# Patient Record
Sex: Female | Born: 1960 | Race: White | Hispanic: No | Marital: Married | Smoking: Former smoker
Health system: Southern US, Community
[De-identification: ages and names within clinical notes are randomized; demographics above are authoritative.]

## PROBLEM LIST (undated history)

## (undated) DIAGNOSIS — I10 Essential (primary) hypertension: Secondary | ICD-10-CM

## (undated) DIAGNOSIS — E7800 Pure hypercholesterolemia, unspecified: Secondary | ICD-10-CM

## (undated) HISTORY — PX: SHOULDER SURGERY: SHX246

## (undated) HISTORY — PX: ABDOMINAL HYSTERECTOMY: SHX81

## (undated) HISTORY — PX: ELBOW SURGERY: SHX618

---

## 1998-06-24 ENCOUNTER — Other Ambulatory Visit: Admission: RE | Admit: 1998-06-24 | Discharge: 1998-06-24 | Payer: Self-pay | Admitting: *Deleted

## 1998-07-16 ENCOUNTER — Other Ambulatory Visit: Admission: RE | Admit: 1998-07-16 | Discharge: 1998-07-16 | Payer: Self-pay | Admitting: *Deleted

## 1998-12-14 ENCOUNTER — Encounter: Payer: Self-pay | Admitting: *Deleted

## 1998-12-16 ENCOUNTER — Encounter (INDEPENDENT_AMBULATORY_CARE_PROVIDER_SITE_OTHER): Payer: Self-pay | Admitting: Specialist

## 1998-12-16 ENCOUNTER — Observation Stay (HOSPITAL_COMMUNITY): Admission: RE | Admit: 1998-12-16 | Discharge: 1998-12-17 | Payer: Self-pay | Admitting: *Deleted

## 1999-07-25 ENCOUNTER — Other Ambulatory Visit: Admission: RE | Admit: 1999-07-25 | Discharge: 1999-07-25 | Payer: Self-pay | Admitting: *Deleted

## 2000-08-06 ENCOUNTER — Other Ambulatory Visit: Admission: RE | Admit: 2000-08-06 | Discharge: 2000-08-06 | Payer: Self-pay | Admitting: *Deleted

## 2001-08-12 ENCOUNTER — Other Ambulatory Visit: Admission: RE | Admit: 2001-08-12 | Discharge: 2001-08-12 | Payer: Self-pay | Admitting: *Deleted

## 2002-08-18 ENCOUNTER — Other Ambulatory Visit: Admission: RE | Admit: 2002-08-18 | Discharge: 2002-08-18 | Payer: Self-pay | Admitting: *Deleted

## 2003-08-31 ENCOUNTER — Other Ambulatory Visit: Admission: RE | Admit: 2003-08-31 | Discharge: 2003-08-31 | Payer: Self-pay | Admitting: *Deleted

## 2004-07-18 ENCOUNTER — Ambulatory Visit (HOSPITAL_COMMUNITY): Admission: RE | Admit: 2004-07-18 | Discharge: 2004-07-18 | Payer: Self-pay | Admitting: Otolaryngology

## 2004-07-18 ENCOUNTER — Ambulatory Visit (HOSPITAL_BASED_OUTPATIENT_CLINIC_OR_DEPARTMENT_OTHER): Admission: RE | Admit: 2004-07-18 | Discharge: 2004-07-18 | Payer: Self-pay | Admitting: Otolaryngology

## 2004-09-07 ENCOUNTER — Other Ambulatory Visit: Admission: RE | Admit: 2004-09-07 | Discharge: 2004-09-07 | Payer: Self-pay | Admitting: *Deleted

## 2005-10-04 ENCOUNTER — Other Ambulatory Visit: Admission: RE | Admit: 2005-10-04 | Discharge: 2005-10-04 | Payer: Self-pay | Admitting: *Deleted

## 2006-10-29 ENCOUNTER — Other Ambulatory Visit: Admission: RE | Admit: 2006-10-29 | Discharge: 2006-10-29 | Payer: Self-pay | Admitting: *Deleted

## 2010-09-09 NOTE — Op Note (Signed)
Shelly Tran, Shelly Tran                ACCOUNT NO.:  [id number]   MEDICAL RECORD NO.:  [id number]          PATIENT TYPE:  AMB   LOCATION:  DSC                          FACILITY:  [facility]   PHYSICIAN:  Kinnie Scales. Annalee Genta, M.D.DATE OF BIRTH:  1960/09/10   DATE OF PROCEDURE:  07/18/2004  DATE OF DISCHARGE:                                 OPERATIVE REPORT   PREOPERATIVE DIAGNOSES:  1.  Deviated nasal septum.  2.  Bilateral inferior turbinate hypertrophy.  3.  Nasal obstruction.   POSTOPERATIVE DIAGNOSES:  1.  Deviated nasal septum.  2.  Bilateral inferior turbinate hypertrophy.  3.  Nasal obstruction.   INDICATIONS FOR SURGERY:  1.  Deviated nasal septum.  2.  Bilateral inferior turbinate hypertrophy.  3.  Nasal obstruction.   SURGICAL PROCEDURES:  1.  Nasal septoplasty.  2.  Bilateral inferior turbinate reduction.   ANESTHESIA:  General endotracheal.   SURGEON:  Kinnie Scales. Annalee Genta, M.D.   COMPLICATIONS:  None.   ESTIMATED BLOOD LOSS:  Less than 50 mL.   Patient transferred from the operating room to the recovery room in stable  condition.   BRIEF HISTORY:  The patient is a 50 year old white female who is referred  for evaluation of chronic nasal airway obstruction.  Examination revealed a  deviated nasal septum with severe posterior right nasal septal spurring and  obstruction of the nasal cavity.  She had turbinate hypertrophy and symptoms  of chronic nasal airway obstruction.  Given her history, examination and  findings and failure to respond to topical steroid therapy and  antihistamines, I recommended that we consider her for nasal septoplasty and  turbinate reduction.  Risks, benefits, and possible complications of the  surgical procedure were discussed in detail with the patient and her  husband, and they understood and concurred with our plan for surgery, which  was scheduled as above.   PROCEDURE:  The patient was brought to the operating room on July 18, 2004,  and placed in the supine position on the operating table.  General  endotracheal anesthesia was established without difficulty, and the patient  was adequately anesthetized.  Her nose was injected with 7 mL of 1%  lidocaine and 1:100,000 solution of epinephrine injected in a submucosal  fashion along the nasal septum and inferior turbinates bilaterally.  The  patient's nose was then packed with Afrin-soaked cottonoid pledgets and left  place for approximately 10 minutes to allow for vasoconstriction and  hemostasis.  The surgical procedure was begun after the patient was prepped  and draped in a sterile fashion by creating a right anterior hemitransfixion  incision.  This was carried through the mucosa and underlying submucosa, and  a mucoperichondrial flap was elevated from anterior to posterior along the  patient's right-hand side.  The bony-cartilaginous junction was crossed and  a mucoperichondrial flap was elevated on the patient's left-hand side.  Deviated bone and cartilage were then resected.  The mid-aspect of the nasal  septal cartilage was removed, morcellized and returned to the  mucoperichondrial pocket at the conclusion of the surgical procedure.  The  patient had  a large bony deviation and septal spur, which was resected with  a 4 mm osteotome, preserving the overlying mucosa.  With the septum brought  to the middle, the removed cartilage was morcellized and returned and the  mucoperichondrial flaps were reapproximated with a 4-0 gut suture in a  horizontal mattressing fashion.  Bilateral nasal septal splints were then  placed after the application of Bactroban ointment and were sutured in  position with a 3-0 Ethilon suture.   Bilateral inferior turbinate reduction was then performed with the cautery  set at 12 watts.  Two submucosal passes were made at each inferior turbinate  using bipolar intramural cautery.  When the turbinates had been adequately  cauterized, the  anterior inferior aspect of the turbinate was resected using  through-cutting forceps, removing turbinate mucosa and bone.  The turbinates  were then outfractured to create a more patent nasal cavity.  The patient  was then awakened from the anesthetic.  She was extubated and was  transferred from the operating room to the recovery room in stable  condition.      DLS/MEDQ  D:  16/01/9603  T:  07/18/2004  Job:  [id number]

## 2012-10-09 ENCOUNTER — Emergency Department (HOSPITAL_COMMUNITY): Payer: BC Managed Care – PPO

## 2012-10-09 ENCOUNTER — Observation Stay (HOSPITAL_COMMUNITY)
Admission: EM | Admit: 2012-10-09 | Discharge: 2012-10-10 | Disposition: A | Discharge status: Home / Self Care | Payer: BC Managed Care – PPO | Attending: Internal Medicine | Admitting: Internal Medicine

## 2012-10-09 ENCOUNTER — Encounter (HOSPITAL_COMMUNITY): Payer: Self-pay | Admitting: *Deleted

## 2012-10-09 DIAGNOSIS — Z79899 Other long term (current) drug therapy: Secondary | ICD-10-CM | POA: Insufficient documentation

## 2012-10-09 DIAGNOSIS — E785 Hyperlipidemia, unspecified: Secondary | ICD-10-CM | POA: Diagnosis present

## 2012-10-09 DIAGNOSIS — I10 Essential (primary) hypertension: Secondary | ICD-10-CM | POA: Diagnosis present

## 2012-10-09 DIAGNOSIS — F17200 Nicotine dependence, unspecified, uncomplicated: Secondary | ICD-10-CM

## 2012-10-09 DIAGNOSIS — R079 Chest pain, unspecified: Principal | ICD-10-CM | POA: Diagnosis present

## 2012-10-09 DIAGNOSIS — E7800 Pure hypercholesterolemia, unspecified: Secondary | ICD-10-CM | POA: Insufficient documentation

## 2012-10-09 HISTORY — DX: Essential (primary) hypertension: I10

## 2012-10-09 HISTORY — DX: Pure hypercholesterolemia, unspecified: E78.00

## 2012-10-09 LAB — CBC WITH DIFFERENTIAL/PLATELET
Basophils Absolute: 0 10*3/uL (ref 0.0–0.1)
Basophils Relative: 0 % (ref 0–1)
Eosinophils Absolute: 0.1 10*3/uL (ref 0.0–0.7)
Eosinophils Relative: 2 % (ref 0–5)
HCT: 44.8 % (ref 36.0–46.0)
Hemoglobin: 14.8 g/dL (ref 12.0–15.0)
Lymphocytes Relative: 42 % (ref 12–46)
Lymphs Abs: 2.8 10*3/uL (ref 0.7–4.0)
MCH: 31.4 pg (ref 26.0–34.0)
MCHC: 33 g/dL (ref 30.0–36.0)
MCV: 95.1 fL (ref 78.0–100.0)
Monocytes Absolute: 0.5 10*3/uL (ref 0.1–1.0)
Monocytes Relative: 7 % (ref 3–12)
Neutro Abs: 3.2 10*3/uL (ref 1.7–7.7)
Neutrophils Relative %: 49 % (ref 43–77)
Platelets: 251 10*3/uL (ref 150–400)
RBC: 4.71 MIL/uL (ref 3.87–5.11)
RDW: 13.4 % (ref 11.5–15.5)
WBC: 6.6 10*3/uL (ref 4.0–10.5)

## 2012-10-09 LAB — BASIC METABOLIC PANEL
BUN: 11 mg/dL (ref 6–23)
CO2: 26 mEq/L (ref 19–32)
Calcium: 10 mg/dL (ref 8.4–10.5)
Chloride: 103 mEq/L (ref 96–112)
Creatinine, Ser: 0.64 mg/dL (ref 0.50–1.10)
GFR calc Af Amer: >90 mL/min (ref >90)
GFR calc non Af Amer: >90 mL/min (ref >90)
Glucose, Bld: 122 mg/dL — ABNORMAL HIGH (ref 70–99)
Potassium: 3.9 mEq/L (ref 3.5–5.1)
Sodium: 140 mEq/L (ref 135–145)

## 2012-10-09 LAB — TROPONIN I: Troponin I: <0.3 ng/mL (ref <0.30)

## 2012-10-09 MED ORDER — SODIUM CHLORIDE 0.9 % IV SOLN: 250.0000 mL | INTRAVENOUS | Status: DC | PRN

## 2012-10-09 MED ORDER — NITROGLYCERIN 0.4 MG SL SUBL: 0.4000 mg | SUBLINGUAL_TABLET | SUBLINGUAL | Status: DC | PRN

## 2012-10-09 MED ORDER — CAPTOPRIL 25 MG PO TABS
25.0000 mg | ORAL_TABLET | Freq: Every day | ORAL | Status: DC
  Administered 2012-10-09 – 2012-10-10 (×2): 25 mg via ORAL
  Filled 2012-10-09 (×2): qty 1

## 2012-10-09 MED ORDER — ENOXAPARIN SODIUM 40 MG/0.4ML ~~LOC~~ SOLN
40.0000 mg | SUBCUTANEOUS | Status: DC
  Administered 2012-10-09: 40 mg via SUBCUTANEOUS
  Filled 2012-10-09 (×2): qty 0.4

## 2012-10-09 MED ORDER — NITROGLYCERIN 0.4 MG SL SUBL
0.4000 mg | SUBLINGUAL_TABLET | SUBLINGUAL | Status: DC | PRN
  Administered 2012-10-09: 0.4 mg via SUBLINGUAL
  Filled 2012-10-09: qty 25

## 2012-10-09 MED ORDER — SODIUM CHLORIDE 0.9 % IJ SOLN
3.0000 mL | Freq: Two times a day (BID) | INTRAMUSCULAR | Status: DC
  Administered 2012-10-10: 3 mL via INTRAVENOUS

## 2012-10-09 MED ORDER — ASPIRIN EC 81 MG PO TBEC
81.0000 mg | DELAYED_RELEASE_TABLET | Freq: Every day | ORAL | Status: DC
  Administered 2012-10-10: 81 mg via ORAL
  Filled 2012-10-09: qty 1

## 2012-10-09 MED ORDER — ACETAMINOPHEN 500 MG PO TABS: 500.0000 mg | ORAL_TABLET | Freq: Four times a day (QID) | ORAL | Status: DC | PRN

## 2012-10-09 MED ORDER — SODIUM CHLORIDE 0.9 % IJ SOLN: 3.0000 mL | INTRAMUSCULAR | Status: DC | PRN

## 2012-10-09 MED ORDER — SODIUM CHLORIDE 0.9 % IJ SOLN
3.0000 mL | Freq: Two times a day (BID) | INTRAMUSCULAR | Status: DC
  Administered 2012-10-09: 3 mL via INTRAVENOUS

## 2012-10-09 MED ORDER — ASPIRIN 81 MG PO CHEW
324.0000 mg | CHEWABLE_TABLET | Freq: Once | ORAL | Status: AC
  Administered 2012-10-09: 324 mg via ORAL
  Filled 2012-10-09: qty 4

## 2012-10-09 MED ORDER — ATORVASTATIN CALCIUM 20 MG PO TABS
20.0000 mg | ORAL_TABLET | Freq: Every day | ORAL | Status: DC
  Administered 2012-10-09 – 2012-10-10 (×2): 20 mg via ORAL
  Filled 2012-10-09 (×2): qty 1

## 2012-10-09 NOTE — ED Notes (Signed)
Pt states at 7:15 this morning started having constant, pressure, crushing mid/L sided chest pain, states also had shortness of breath, denies dizziness/lightheadedness/n/v, pt states pain when it started was 8/10, chest pain now is 3/10, pt states she still feels like she can't take a deep breath.

## 2012-10-09 NOTE — Progress Notes (Signed)
Pt states pcp is Maurice Small EPIC updated

## 2012-10-09 NOTE — H&P (Signed)
[facility] History and Physical  Chandler Stofer WUJ:[id number] DOB: 1960-06-28 DOA: 10/09/2012  Referring physician: Dr Juleen China PCP: Astrid Divine, MD   Chief Complaint: Chest pain since one day  HPI:  52 year old female with history of hypertension, hyperlipidemia, history of smoking for possibly 5 years presented to the ED with severe left-sided chest pain 7-8/10 in intensity, nonradiating lasting for almost 45 minutes and without any aggravating or relieving factors. He shouldn't describes the pain to be of severe tightening as if somebody was sitting on her chest. She denies any shortness of breath, orthopnea or PND. She did feel sweaty during this episode. She also informs of having some palpitations last night. Patient denies similar symptoms in the past. She denies any trauma to the chest or lifting heavy weight or heavy exercising recently. She denies any fever, chills, headache, blurred vision, abdominal pain, nausea, vomiting, bowel or urinary symptoms. Denies any sick contacts or recent URI symptoms. Denies having any stress test in the past. At baseline patient is quite active and works out almost regularly which includes biking and swimming.  In the ED patient's vitals were stable. EKG done was unremarkable and initial troponin was negative. Labs and chest x-ray were within normal limits. Patient given overgrows of aspirin and sublingual nitroglycerin following which her chest pain subsided within the next hour. Prior hospitalists called for admission and observation to rule out for ACS.  Review of Systems:  Constitutional: diaphoresis, Denies fever, chills,  appetite change and fatigue.  HEENT: Denies photophobia, eye pain, redness, hearing loss, ear pain, congestion, sore throat, rhinorrhea, sneezing, mouth sores, trouble swallowing, neck pain, neck stiffness and tinnitus.   Respiratory: chest tightness,Denies SOB, DOE, cough,   and wheezing.   Cardiovascular:  chest  pain present, denies palpitations and leg swelling.  Gastrointestinal: Denies nausea, vomiting, abdominal pain, diarrhea, constipation, blood in stool and abdominal distention.  Genitourinary: Denies dysuria, urgency, frequency, hematuria, flank pain and difficulty urinating.  Endocrine: Denies: hot or cold intolerance, sweats, polyuria, polydipsia. Musculoskeletal: Denies myalgias, back pain, joint swelling, arthralgias and gait problem.  Neurological: Denies dizziness, seizures, syncope, weakness, light-headedness, numbness and headaches.  Hematological: Denies adenopathy. Easy bruising, personal or family bleeding history     Past Medical History  Diagnosis Date  . Hypertension   . High cholesterol    Past Surgical History  Procedure Laterality Date  . Shoulder surgery    . Elbow surgery    . Abdominal hysterectomy     Social History:  reports that she has been smoking Cigarettes.  She has been smoking about 0.00 packs per day. She has never used smokeless tobacco. She reports that she does not drink alcohol or use illicit drugs.  No Known Allergies  Family History  Problem Relation Age of Onset  . High blood pressure Mother   . Hyperlipidemia Mother   . High blood pressure Father   . Leukemia Father     Prior to Admission medications   Medication Sig Start Date End Date Taking? Authorizing Provider  acetaminophen (TYLENOL) 500 MG tablet Take 500 mg by mouth every 6 (six) hours as needed for pain.   Yes Historical Provider, MD  atorvastatin (LIPITOR) 20 MG tablet Take 20 mg by mouth daily.   Yes Historical Provider, MD  captopril (CAPOTEN) 25 MG tablet Take 25 mg by mouth daily.   Yes Historical Provider, MD    Physical Exam:  Filed Vitals:   10/09/12 1100 10/09/12 1130 10/09/12 1204 10/09/12 1357  BP: 132/75  128/76 160/85 148/88  Pulse: 60 59  51  Temp:   98.2 F (36.8 C)   TempSrc:   Oral   Resp: 19 18 14 16   SpO2: 99% 99% 98% 100%    Constitutional: Vital  signs reviewed.  Patient is a well-developed and well-nourished in no acute distress and cooperative with exam.  HEENT: No pallor, moist oral mucosa, no JVD Cardiovascular: RRR, S1 normal, S2 normal, no MRG, pulses symmetric and intact bilaterally Pulmonary/Chest: CTAB, no wheezes, rales, or rhonchi Abdominal: Soft. Non-tender, non-distended, bowel sounds are normal Ext: no edema and no cyanosis, pulses palpable b/l   Neurological: A&O x3, nonfocal   Labs on Admission:  Basic Metabolic Panel:  Recent Labs Lab 10/09/12 0841  NA 140  K 3.9  CL 103  CO2 26  GLUCOSE 122*  BUN 11  CREATININE 0.64  CALCIUM 10.0   Liver Function Tests: No results found for this basename: AST, ALT, ALKPHOS, BILITOT, PROT, ALBUMIN,  in the last 168 hours No results found for this basename: LIPASE, AMYLASE,  in the last 168 hours No results found for this basename: AMMONIA,  in the last 168 hours CBC:  Recent Labs Lab 10/09/12 0841  WBC 6.6  NEUTROABS 3.2  HGB 14.8  HCT 44.8  MCV 95.1  PLT 251   Cardiac Enzymes:  Recent Labs Lab 10/09/12 0841  TROPONINI <0.30   BNP: No components found with this basename: POCBNP,  CBG: No results found for this basename: GLUCAP,  in the last 168 hours  Radiological Exams on Admission: Dg Chest 2 View  10/09/2012   *RADIOLOGY REPORT*  Clinical Data: Chest pain.  Hypertension.  CHEST - 2 VIEW  Comparison: None.  Findings: The lungs are clear.  Heart size is normal.  No pneumothorax or pleural fluid.  No focal bony abnormality.  IMPRESSION: No acute disease.   Original Report Authenticated By: Holley Dexter, M.D.    EKG: Normal sinus rhythm at 84, no ST-T changes  Assessment/Plan  Chest pain Admit to telemetry under observation to rule out for ACS Initial EKG and troponin negative. Symptoms resolved after getting aspirin and sublingual nitroglycerin in the ED. -Continue on aspirin and sublingual nitroglycerin prn for chest pain. Risk factors  include hypertension, hyperlipidemia and active smoking. Check lipid panel and hemoglobin A1c Given underlying cardiac risk factors and chest pain symptoms which appears typical will rule out for ACS and if serial cardiac enzymes negative and symptoms resolved I will obtain a nuclear stress test for tomorrow morning.    Active Problems:   Hypertension Blood pressure stable. The humeral medications     Hyperlipidemia Check lipid panel. Resume home medications     Active smoker Counseled strongly on smoking cessation  Diet: Cardiac. N.p.o. after midnight if planned for stress tomorrow morning  DVT prophylaxis: Subcutaneous Lovenox  Code Status: Full code Family Communication: Sister at bedside Disposition Plan: Home tomorrow if ruled out for ACS  Eddie North [facility] Pager [phone]  If 7PM-7AM, please contact night-coverage [email or web address] Password [facility] 10/09/2012, 2:44 PM    Total time spent: 50 minutes

## 2012-10-09 NOTE — Care Management Note (Signed)
    Page 1 of 1   10/09/2012     4:01:58 PM   CARE MANAGEMENT NOTE 10/09/2012  Patient:  Shelly Tran, Shelly Tran   Account Number:  [id number]  Date Initiated:  10/09/2012  Documentation initiated by:  Lanier Clam  Subjective/Objective Assessment:   ADMITTED W/CHEST PAIN,HTN.HX:HTN     Action/Plan:   FROM HOME.HAS PCP,PHARMACY.   Anticipated DC Date:  10/10/2012   Anticipated DC Plan:  HOME/SELF CARE      DC Planning Services  CM consult      Choice offered to / List presented to:             Status of service:  In process, will continue to follow Medicare Important Message given?   (If response is "NO", the following Medicare IM given date fields will be blank) Date Medicare IM given:   Date Additional Medicare IM given:    Discharge Disposition:    Per UR Regulation:  Reviewed for med. necessity/level of care/duration of stay  If discussed at Long Length of Stay Meetings, dates discussed:    Comments:  10/09/12 [facility] RN,BSN NCM [phone]

## 2012-10-09 NOTE — ED Provider Notes (Signed)
History    52 year old female with chest pain. Onset around 7:15 this morning while sitting at her desk. Patient describes the pain as "crushing." Substernal to left anterior chest. No radiation. Pain has been constant since onset but has been slowly improving. It is not completely resolved. Pain worsened by deep inspiration. Patient has a sensation that she cannot take a deep breath. No fevers or chills. No coughing. No unusual leg pain or swelling. No nausea or diaphoresis. Patient states that she has had chest pain before but that prior episodes were very fleeting, lasting only seconds. Patient with no known history of coronary disease but she does have a history of hypertension, high cholesterol and she is a smoker. She's never had a stress test or catheterization. No interventions prior to arrival.   CSN: [id number]  Arrival date & time 10/09/12  1478   First MD Initiated Contact with Patient 10/09/12 0825      Chief Complaint  Patient presents with  . Chest Pain  . Shortness of Breath    (Consider location/radiation/quality/duration/timing/severity/associated sxs/prior treatment) HPI  Past Medical History  Diagnosis Date  . Hypertension   . High cholesterol     Past Surgical History  Procedure Laterality Date  . Shoulder surgery    . Elbow surgery    . Abdominal hysterectomy      No family history on file.  History  Substance Use Topics  . Smoking status: Current Some Day Smoker  . Smokeless tobacco: Never Used  . Alcohol Use: No    OB History   Grav Para Term Preterm Abortions TAB SAB Ect Mult Living                  Review of Systems  All systems reviewed and negative, other than as noted in HPI.  Allergies  Review of patient's allergies indicates not on file.  Home Medications  No current outpatient prescriptions on file.  BP 151/84  Pulse 81  Temp(Src) 98.2 F (36.8 C) (Oral)  Resp 13  SpO2 99%  Physical Exam  Nursing note and vitals  reviewed. Constitutional: She appears well-developed and well-nourished. No distress.  Laying in bed. NAD.   HENT:  Head: Normocephalic and atraumatic.  Eyes: Conjunctivae are normal. Right eye exhibits no discharge. Left eye exhibits no discharge.  Neck: Neck supple.  Cardiovascular: Normal rate, regular rhythm and normal heart sounds.  Exam reveals no gallop and no friction rub.   No murmur heard. Pulmonary/Chest: Effort normal and breath sounds normal. No respiratory distress. She exhibits no tenderness.  CP not reproducible with palpation.   Abdominal: Soft. She exhibits no distension. There is no tenderness.  Musculoskeletal: She exhibits no edema and no tenderness.  Lower extremities symmetric as compared to each other. No calf tenderness. Negative Homan's. No palpable cords.   Neurological: She is alert.  Skin: Skin is warm and dry. She is not diaphoretic.  Psychiatric: She has a normal mood and affect. Her behavior is normal. Thought content normal.    ED Course  Procedures (including critical care time)  Labs Reviewed  BASIC METABOLIC PANEL - Abnormal; Notable for the following:    Glucose, Bld 122 (*)    All other components within normal limits  CBC WITH DIFFERENTIAL  TROPONIN I   Dg Chest 2 View  10/09/2012   *RADIOLOGY REPORT*  Clinical Data: Chest pain.  Hypertension.  CHEST - 2 VIEW  Comparison: None.  Findings: The lungs are clear.  Heart size is  normal.  No pneumothorax or pleural fluid.  No focal bony abnormality.  IMPRESSION: No acute disease.   Original Report Authenticated By: Holley Dexter, M.D.    EKG:  Rhythm: normal sinus Vent. rate 84 BPM PR interval 136 ms QRS duration 90 ms QT/QTc 372/440 ms ST segments: NS ST changes. Some t wave flattening in v3 and aVL. Comparison: little interval change from 06/2004   1. Chest pain       MDM  52yF with CP. HPI concerning for cardiac etiology. Doubt infectious, PE, dissection. Possible spontaneous  pneumothorax, particularly with smoking hx and somewhat pleuritic nature. "Crushing" sensation atypical for this though.  Pt with no known CAD, but multiple risk factors. EKG fairly unremarkable. Plan basic labs, troponin and CXR. ASA and nitro given continued mild discomfort and HTN. I do not feel pt is low enough risk for DC particularly with symptoms starting shortly before arrival making enzymatic r/o in ED prohibitive.    W/u unremarkable. Pain resolved after nitro. Will discuss with medicine.      Raeford Razor, MD 10/09/12 [id number]

## 2012-10-10 ENCOUNTER — Encounter (HOSPITAL_COMMUNITY): Payer: Self-pay | Admitting: Cardiology

## 2012-10-10 ENCOUNTER — Other Ambulatory Visit: Payer: Self-pay | Admitting: *Deleted

## 2012-10-10 DIAGNOSIS — F17200 Nicotine dependence, unspecified, uncomplicated: Secondary | ICD-10-CM

## 2012-10-10 DIAGNOSIS — R079 Chest pain, unspecified: Secondary | ICD-10-CM

## 2012-10-10 DIAGNOSIS — I10 Essential (primary) hypertension: Secondary | ICD-10-CM

## 2012-10-10 DIAGNOSIS — E785 Hyperlipidemia, unspecified: Secondary | ICD-10-CM

## 2012-10-10 LAB — HEMOGLOBIN A1C: Hgb A1c MFr Bld: 5.4 % (ref <5.7)

## 2012-10-10 LAB — LIPID PANEL: LDL Cholesterol: 54 mg/dL (ref 0–99)

## 2012-10-10 NOTE — Discharge Summary (Addendum)
Physician Discharge Summary  Amyla Heffner RUE:[id number] DOB: 30-Oct-1960 DOA: 10/09/2012  PCP: Astrid Divine, MD  Admit date: 10/09/2012 Discharge date: 10/10/2012  Time spent: 35 minutes  Recommendations for Outpatient Follow-up:  1. [facility] 7/10 for Execise stress test on 7/10  Discharge Diagnoses:  Active Problems:   Chest pain, unspecified   Hypertension   Hyperlipidemia   Active smoker   Discharge Condition: stable  Diet recommendation: low sodium, heart healthy  Filed Weights   10/09/12 1535  Weight: 52.844 kg (116 lb 8 oz)    History of present illness:  52 year old female with history of hypertension, hyperlipidemia, history of smoking for possibly 5 years presented to the ED with severe left-sided chest pain 7-8/10 in intensity, nonradiating lasting for almost 45 minutes and without any aggravating or relieving factors. He shouldn't describes the pain to be of severe tightening as if somebody was sitting on her chest. She denies any shortness of breath, orthopnea or PND. She did feel sweaty during this episode. She also informs of having some palpitations last night. Patient denies similar symptoms in the past   Hospital Course:  Atypical chest pain with some typical features, this was resolved by admission with SL nitro and ASA,  EKG done was normal and 3 sets of cardiac enzymes were normal, no events on Telemetry, due to multiple risk factors was seen by Dr.Hochrein from [facility] cardiology in consultation and is being set up for an exercise stress test as outpatient on 7/10 at [facility].   Consultations:  [place] cardiology Dr.Hochrein  Discharge Exam: Filed Vitals:   10/09/12 1851 10/09/12 2042 10/10/12 0618 10/10/12 1001  BP: 123/72 110/67 119/72 130/75  Pulse: 57 56 62   Temp:  98.1 F (36.7 C) 97.8 F (36.6 C)   TempSrc:  Oral Oral   Resp:  18 20   Height:      Weight:      SpO2:  98% 100%     General:  AAOx3 Cardiovascular: S1S2/RRR Respiratory: CTAB  Discharge Instructions  Discharge Orders   Future Appointments Provider Department Dept Phone   10/31/2012 9:30 AM Dyann Kief, PA-C [place] [facility] Main Office [place]) [phone]   Joint Appt [facility] Main Office [place]) [phone]   Future Orders Complete By Expires     Diet - low sodium heart healthy  As directed     Increase activity slowly  As directed         Medication List    TAKE these medications       acetaminophen 500 MG tablet  Commonly known as:  TYLENOL  Take 500 mg by mouth every 6 (six) hours as needed for pain.     atorvastatin 20 MG tablet  Commonly known as:  LIPITOR  Take 20 mg by mouth daily.     captopril 25 MG tablet  Commonly known as:  CAPOTEN  Take 25 mg by mouth daily.       No Known Allergies     Follow-up Information   Follow up with [facility] Main Office [facility]) On 10/31/2012. (at 9:30am, office will call with instructions)    Contact information:   [address], [address] [place] [address] [phone]       The results of significant diagnostics from this hospitalization (including imaging, microbiology, ancillary and laboratory) are listed below for reference.    Significant Diagnostic Studies: Dg Chest 2 View  10/09/2012   *RADIOLOGY REPORT*  Clinical  Data: Chest pain.  Hypertension.  CHEST - 2 VIEW  Comparison: None.  Findings: The lungs are clear.  Heart size is normal.  No pneumothorax or pleural fluid.  No focal bony abnormality.  IMPRESSION: No acute disease.   Original Report Authenticated By: Holley Dexter, M.D.    Microbiology: No results found for this or any previous visit (from the past 240 hour(s)).   Labs: Basic Metabolic Panel:  Recent Labs Lab 10/09/12 0841  NA 140  K 3.9  CL 103  CO2 26  GLUCOSE 122*  BUN 11  CREATININE 0.64  CALCIUM 10.0   Liver Function Tests: No results found for  this basename: AST, ALT, ALKPHOS, BILITOT, PROT, ALBUMIN,  in the last 168 hours No results found for this basename: LIPASE, AMYLASE,  in the last 168 hours No results found for this basename: AMMONIA,  in the last 168 hours CBC:  Recent Labs Lab 10/09/12 0841  WBC 6.6  NEUTROABS 3.2  HGB 14.8  HCT 44.8  MCV 95.1  PLT 251   Cardiac Enzymes:  Recent Labs Lab 10/09/12 0841 10/09/12 1445 10/09/12 2017  TROPONINI <0.30 <0.30 <0.30   BNP: BNP (last 3 results) No results found for this basename: PROBNP,  in the last 8760 hours CBG: No results found for this basename: GLUCAP,  in the last 168 hours     Signed:  Yamileth Hayse  [facility] 10/10/2012, 2:30 PM

## 2012-10-10 NOTE — Consult Note (Signed)
CARDIOLOGY CONSULT NOTE  Patient ID: Shelly Tran MRN: [id number] DOB/AGE: [birth year] 52 y.o.  Admit date: 10/09/2012 Primary Physician Astrid Divine, MD Primary Cardiologist None Chief Complaint  Chest pain  HPI:  The patient has no prior cardiac history.  She does however have risk factors.  She reports having pain yesterday AM at rest.  This was mid chest and crushing. It was 4/10 at the peak of intensity.  There was slight radiation upwards.  She had no associated symptoms such as nausea, vomiting or acute SOB.  The pain lasted for a couple of hours.  She was advised to go to the ER.  She did have relief after NTG SL x 1 and ASA.  She has had no recurrence of this pain.  She has had no EKG changes and enzymes have been negative.  She never had pain like this before.  She is quite active and exercises routinely without this discomfort.      Past Medical History  Diagnosis Date  . Hypertension   . High cholesterol     Past Surgical History  Procedure Laterality Date  . Shoulder surgery    . Elbow surgery    . Abdominal hysterectomy      No Known Allergies  Prescriptions prior to admission  Medication Sig Dispense Refill  . acetaminophen (TYLENOL) 500 MG tablet Take 500 mg by mouth every 6 (six) hours as needed for pain.      Marland Kitchen atorvastatin (LIPITOR) 20 MG tablet Take 20 mg by mouth daily.      . captopril (CAPOTEN) 25 MG tablet Take 25 mg by mouth daily.       Family History  Problem Relation Age of Onset  . High blood pressure Mother   . Hyperlipidemia Mother   . High blood pressure Father   . Leukemia Father     History   Social History  . Marital Status: Married    Spouse Name: N/A    Number of Children: N/A  . Years of Education: N/A   Occupational History  . Not on file.   Social History Main Topics  . Smoking status: Current Some Day Smoker    Types: Cigarettes  . Smokeless tobacco: Never Used  . Alcohol Use: No  . Drug Use: No  . Sexually  Active: Yes    Birth Control/ Protection: None   Other Topics Concern  . Not on file   Social History Narrative  . No narrative on file     ROS:  As stated in the HPI and negative for all other systems.  Physical Exam: Blood pressure 130/75, pulse 62, temperature 97.8 F (36.6 C), temperature source Oral, resp. rate 20, height 5\' 3"  (1.6 m), weight 116 lb 8 oz (52.844 kg), SpO2 100.00%.  GENERAL:  Well appearing HEENT:  Pupils equal round and reactive, fundi not visualized, oral mucosa unremarkable NECK:  No jugular venous distention, waveform within normal limits, carotid upstroke brisk and symmetric, no bruits, no thyromegaly LYMPHATICS:  No cervical, inguinal adenopathy LUNGS:  Clear to auscultation bilaterally BACK:  No CVA tenderness CHEST:  Unremarkable HEART:  PMI not displaced or sustained,S1 and S2 within normal limits, no S3, no S4, no clicks, no rubs, no murmurs ABD:  Flat, positive bowel sounds normal in frequency in pitch, no bruits, no rebound, no guarding, no midline pulsatile mass, no hepatomegaly, no splenomegaly EXT:  2 plus pulses throughout, no edema, no cyanosis no clubbing SKIN:  No rashes no nodules NEURO:  Cranial nerves II through XII grossly intact, motor grossly intact throughout PSYCH:  Cognitively intact, oriented to person place and time   Labs: Lab Results  Component Value Date   BUN 11 10/09/2012   Lab Results  Component Value Date   CREATININE 0.64 10/09/2012   Lab Results  Component Value Date   NA 140 10/09/2012   K 3.9 10/09/2012   CL 103 10/09/2012   CO2 26 10/09/2012   Lab Results  Component Value Date   TROPONINI <0.30 10/09/2012   Lab Results  Component Value Date   WBC 6.6 10/09/2012   HGB 14.8 10/09/2012   HCT 44.8 10/09/2012   MCV 95.1 10/09/2012   PLT 251 10/09/2012   Lab Results  Component Value Date   CHOL 143 10/10/2012   HDL 69 10/10/2012   LDLCALC 54 10/10/2012   TRIG 101 10/10/2012   CHOLHDL 2.1 10/10/2012     Radiology: CXR:  No acute disease.  EKG:NSR, rate 84, axis WNL, no acute ST T wave changes.  10/09/2012  ASSESSMENT AND PLAN:   CHEST PAIN:  Her pain is atypical and atypical features. There is no objective evidence of ischemia. She does have risk factors. I think she can be sent home for exercise treadmill test and we will arrange this.  TOBACCO USE:  She commits to stopping smoking.   Please check the Appts tab above for the time and date of the schedule ETT.    SignedRollene Rotunda 10/10/2012, 12:48 PM    \

## 2012-10-11 ENCOUNTER — Ambulatory Visit (HOSPITAL_COMMUNITY): Payer: BC Managed Care – PPO | Attending: Cardiology

## 2012-10-11 ENCOUNTER — Ambulatory Visit (INDEPENDENT_AMBULATORY_CARE_PROVIDER_SITE_OTHER): Payer: BC Managed Care – PPO | Admitting: Physician Assistant

## 2012-10-11 DIAGNOSIS — R079 Chest pain, unspecified: Secondary | ICD-10-CM

## 2012-10-11 NOTE — Progress Notes (Signed)
Exercise Treadmill Test  Pre-Exercise Testing Evaluation Rhythm: normal sinus  Rate: 77                 Test  Exercise Tolerance Test Ordering MD: Angelina Sheriff, MD  Interpreting MD: Tereso Newcomer, PA-C  Unique Test No: 1  Treadmill:  2  Indication for ETT: chest pain - rule out ischemia  Contraindication to ETT: No   Stress Modality: exercise - treadmill  Cardiac Imaging Performed: non   Protocol: standard Bruce - maximal  Max BP: 201 /114  Max MPHR (bpm):  166 85% MPR (bpm):  141  MPHR obtained (bpm):  171 % MPHR obtained:  103  Reached 85% MPHR (min:sec):  0:40 Total Exercise Time (min-sec):  3:00  Workload in METS:  4.6 Borg Scale: 16  Reason ETT Terminated:  exaggerated hypertensive response    ST Segment Analysis At Rest: normal ST segments - no evidence of significant ST depression With Exercise: no evidence of significant ST depression  Other Information Arrhythmia:  No Angina during ETT:  absent (0) Quality of ETT:  diagnostic  ETT Interpretation:  normal - no evidence of ischemia by ST analysis  Comments: Fair exercise tolerance. No chest pain. Hypertensive BP response to exercise. No ST-T changes to suggest ischemia.   Recommendations: Test stopped due to exaggerated hypertensive BP response to exercise. No CP or ST changes. F/u with Dr. Rollene Rotunda as directed. F/u with Astrid Divine, MD for control of BP. Signed, Tereso Newcomer, PA-C   10/11/2012 12:24 PM

## 2012-10-31 ENCOUNTER — Encounter: Payer: BC Managed Care – PPO | Admitting: Physician Assistant

## 2013-02-27 ENCOUNTER — Other Ambulatory Visit: Payer: Self-pay

## 2014-08-21 IMAGING — CR DG CHEST 2V
2 series · 2 of 2 positions shown · non-contrast
Comparison: None.

CLINICAL DATA: Chest pain.  Hypertension.

CHEST - 2 VIEW

[w chest pa]
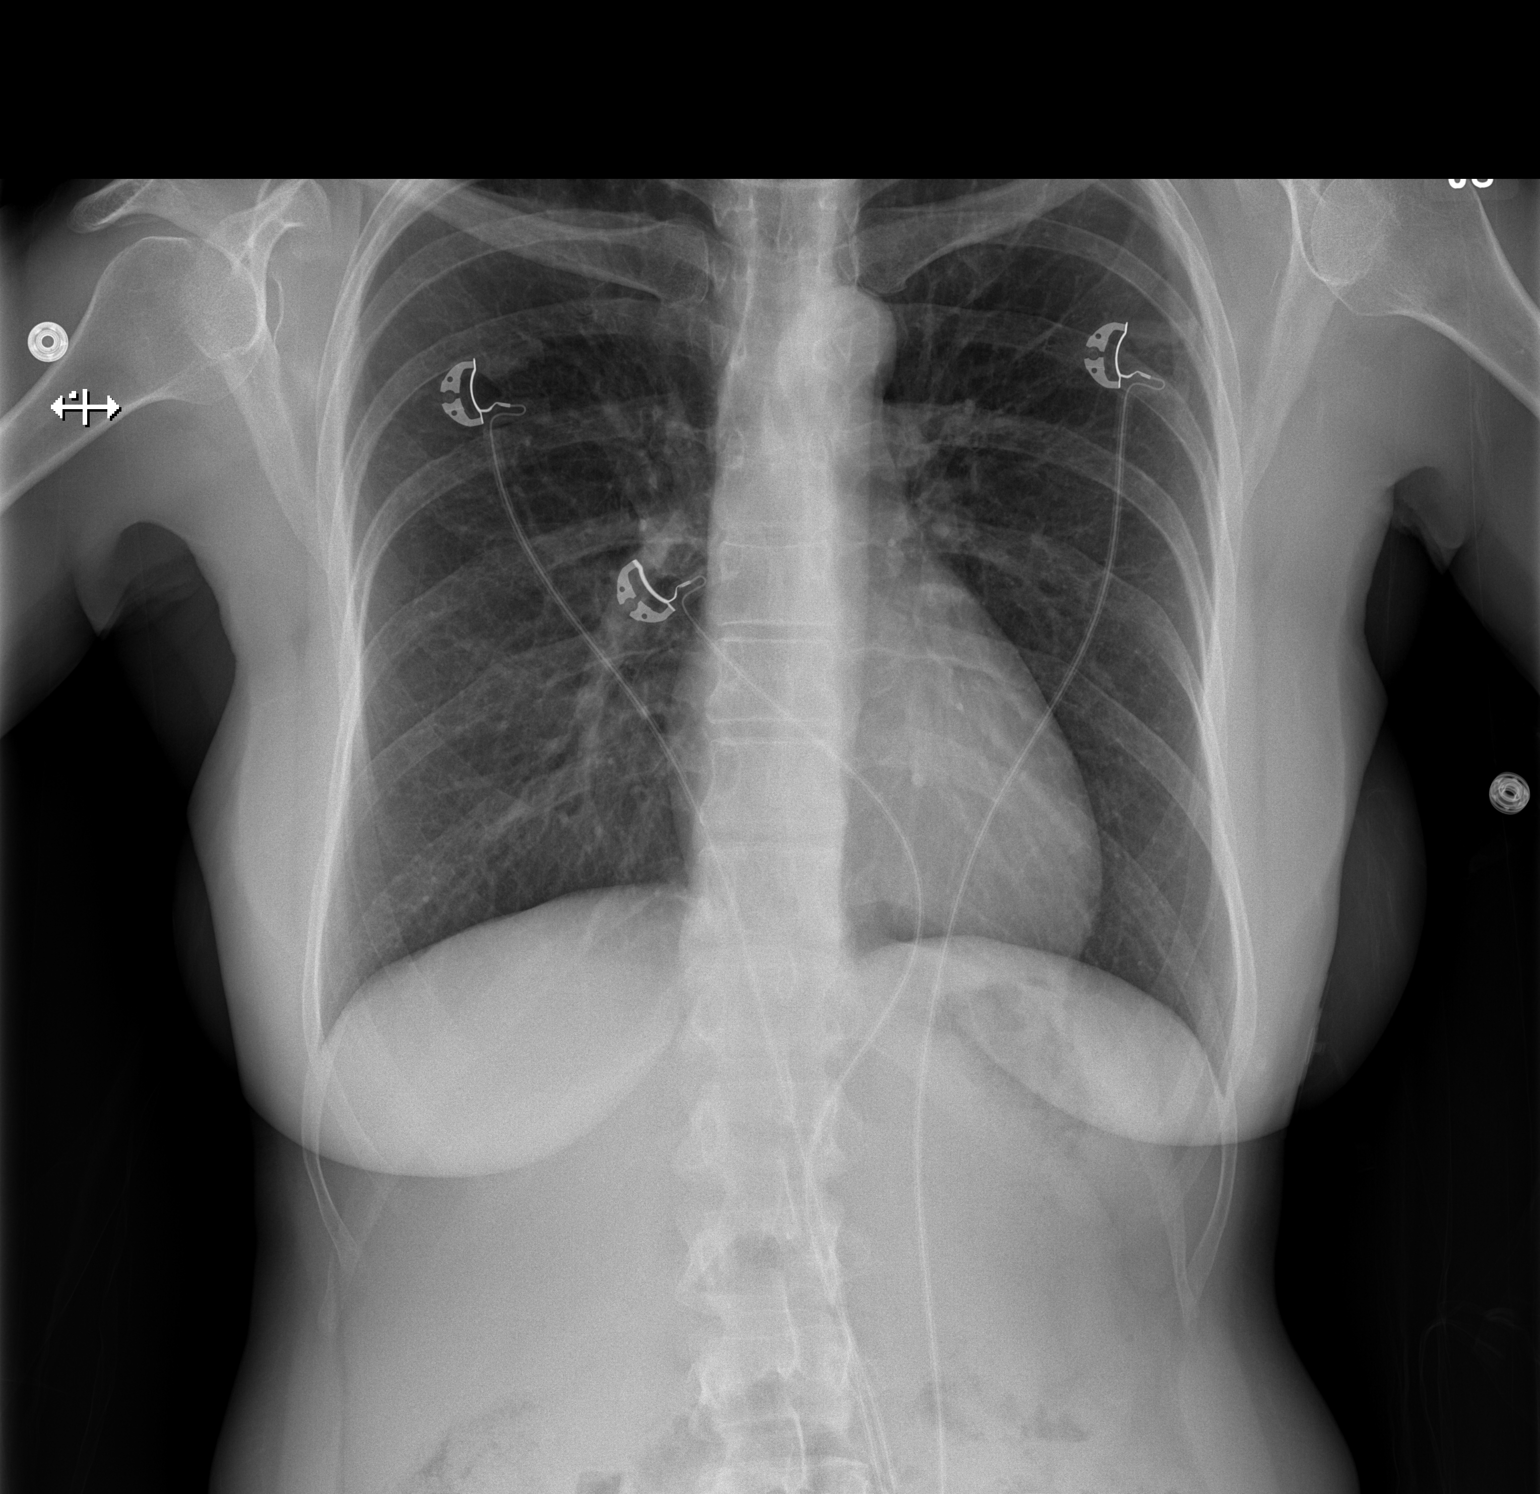

[w chest lat]
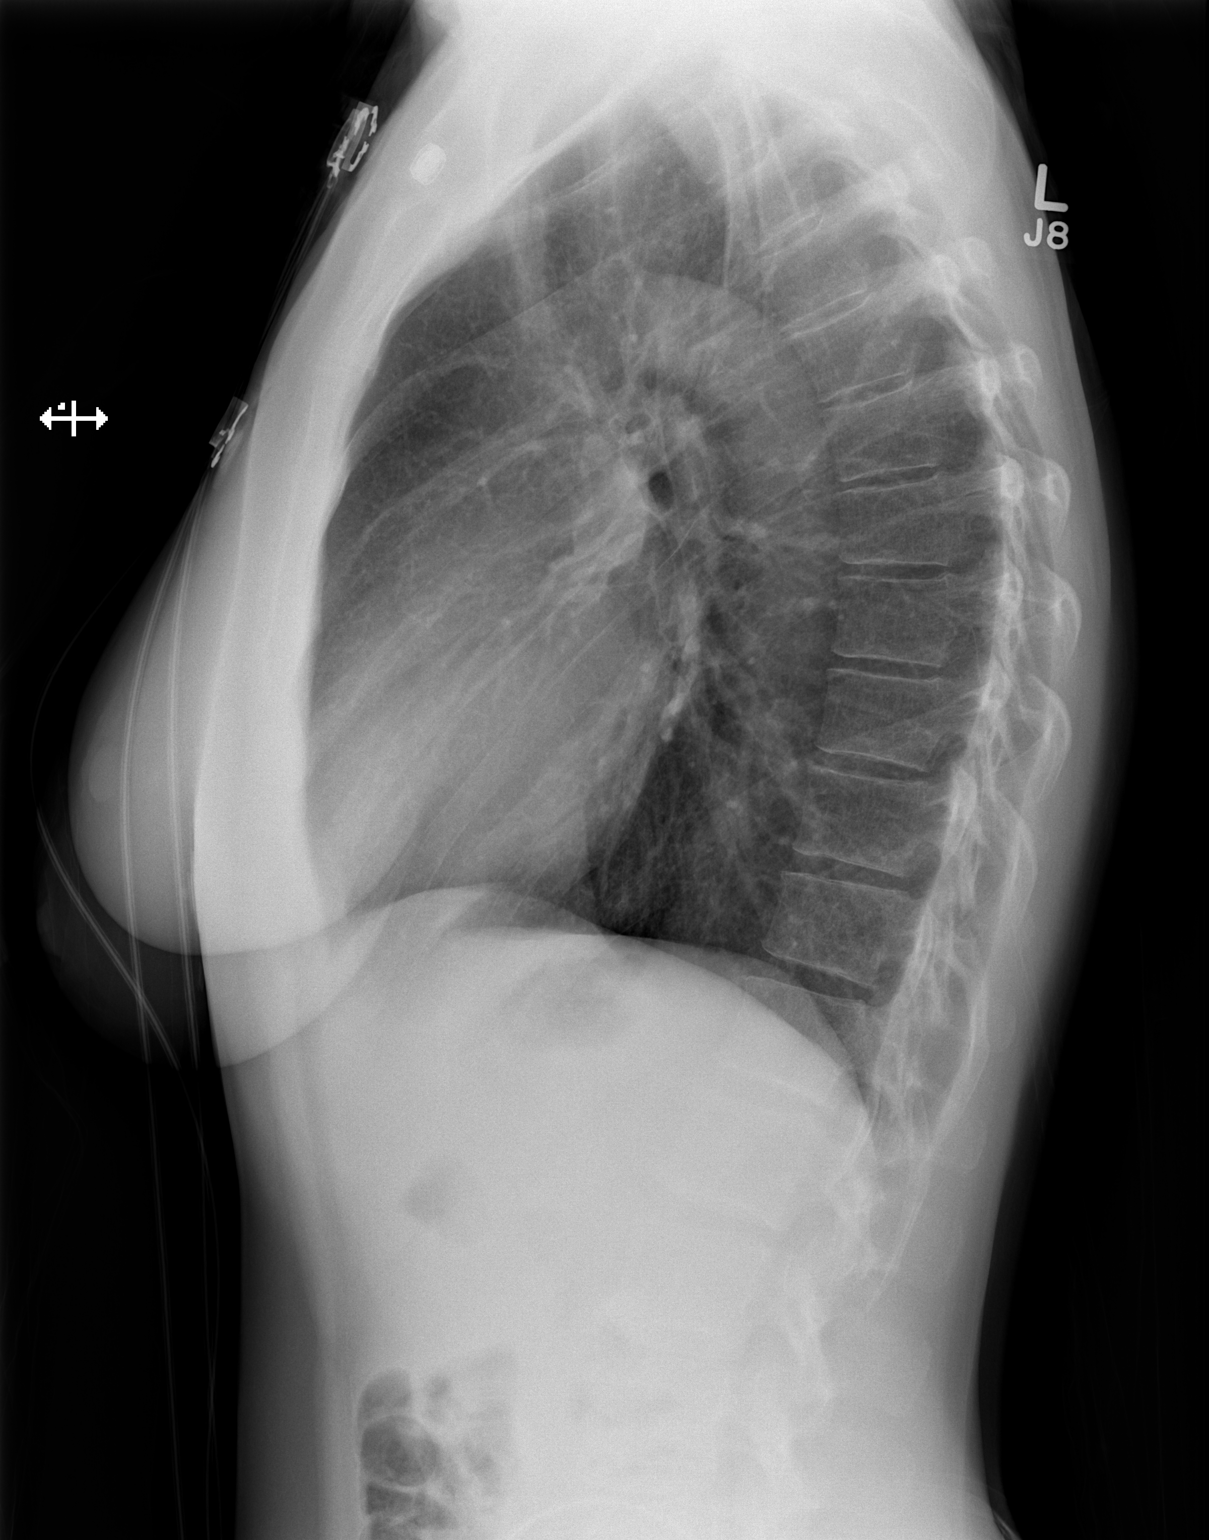

[2 of 2 positions shown; findings below may reference images not displayed]

FINDINGS: The lungs are clear.  Heart size is normal.  No
pneumothorax or pleural fluid.  No focal bony abnormality.
IMPRESSION: No acute disease.

## 2018-01-17 ENCOUNTER — Other Ambulatory Visit: Payer: Self-pay

## 2018-01-17 DIAGNOSIS — I83891 Varicose veins of right lower extremities with other complications: Secondary | ICD-10-CM

## 2018-01-23 ENCOUNTER — Other Ambulatory Visit: Payer: Self-pay

## 2018-01-23 ENCOUNTER — Ambulatory Visit (HOSPITAL_COMMUNITY)
Admission: RE | Admit: 2018-01-23 | Discharge: 2018-01-23 | Disposition: A | Discharge status: Home / Self Care | Payer: 59 | Source: Ambulatory Visit | Attending: Vascular Surgery | Admitting: Vascular Surgery

## 2018-01-23 ENCOUNTER — Encounter: Payer: Self-pay | Admitting: Vascular Surgery

## 2018-01-23 ENCOUNTER — Ambulatory Visit (INDEPENDENT_AMBULATORY_CARE_PROVIDER_SITE_OTHER): Payer: BLUE CROSS/BLUE SHIELD | Admitting: Vascular Surgery

## 2018-01-23 VITALS — BP 141/81 | HR 53 | Resp 18 | Ht 63.0 in | Wt 121.0 lb

## 2018-01-23 DIAGNOSIS — I83811 Varicose veins of right lower extremities with pain: Secondary | ICD-10-CM

## 2018-01-23 DIAGNOSIS — I83891 Varicose veins of right lower extremities with other complications: Secondary | ICD-10-CM | POA: Insufficient documentation

## 2018-01-23 DIAGNOSIS — I868 Varicose veins of other specified sites: Secondary | ICD-10-CM

## 2018-01-23 NOTE — Progress Notes (Signed)
Referring Physician: Venancio Poisson  Patient name: Shelly Tran MRN: [id number] DOB: 1960-05-13 Sex: female  REASON FOR CONSULT: Symptomatic varicose veins with pain  HPI: Shelly Tran is a 57 y.o. female, with about a 49-month history of irritation and aching over a cluster of varicosities in her right leg.  She does not really complain of problems in the left leg.  She denies prior ulcerations.  She does develop some aching around this cluster varicosities in the medial aspect of her leg.  She is on her feet all day during work and this can become progressive as the day moves on.  Her legs are better the next morning.  She apparently had what sounds like an episode of thrombophlebitis in this area recently.  She developed a hard knot and pain which then slowly resolved over the next few weeks.  Denies prior history of DVT.  She has a family history of varicose veins in her brother and sister.  She is not currently wearing compression stockings.  Other medical problems include over the cholesterol and hypertension.  Both are currently stable.  Past Medical History:  Diagnosis Date  . High cholesterol   . Hypertension    Past Surgical History:  Procedure Laterality Date  . ABDOMINAL HYSTERECTOMY    . ELBOW SURGERY     right  . SHOULDER SURGERY     bilateral arthroscopic    Family History  Problem Relation Age of Onset  . High blood pressure Mother   . Hyperlipidemia Mother   . High blood pressure Father   . Leukemia Father     SOCIAL HISTORY: Social History   Socioeconomic History  . Marital status: Married    Spouse name: Not on file  . Number of children: Not on file  . Years of education: Not on file  . Highest education level: Not on file  Occupational History  . Not on file  Social Needs  . Financial resource strain: Not on file  . Food insecurity:    Worry: Not on file    Inability: Not on file  . Transportation needs:    Medical: Not on file    Non-medical: Not  on file  Tobacco Use  . Smoking status: Former Smoker    Types: Cigarettes    Last attempt to quit: 04/2007    Years since quitting: 10.7  . Smokeless tobacco: Never Used  . Tobacco comment: Not everyday   Substance and Sexual Activity  . Alcohol use: No  . Drug use: No  . Sexual activity: Yes    Birth control/protection: None  Lifestyle  . Physical activity:    Days per week: Not on file    Minutes per session: Not on file  . Stress: Not on file  Relationships  . Social connections:    Talks on phone: Not on file    Gets together: Not on file    Attends religious service: Not on file    Active member of club or organization: Not on file    Attends meetings of clubs or organizations: Not on file    Relationship status: Not on file  . Intimate partner violence:    Fear of current or ex partner: Not on file    Emotionally abused: Not on file    Physically abused: Not on file    Forced sexual activity: Not on file  Other Topics Concern  . Not on file  Social History Narrative   Lives at  home with husband.    No Known Allergies  Current Outpatient Medications  Medication Sig Dispense Refill  . atorvastatin (LIPITOR) 20 MG tablet Take 20 mg by mouth daily.    . captopril (CAPOTEN) 25 MG tablet Take 25 mg by mouth daily.    . metoprolol succinate (TOPROL-XL) 25 MG 24 hr tablet Take by mouth.    Marland Kitchen acetaminophen (TYLENOL) 500 MG tablet Take 500 mg by mouth every 6 (six) hours as needed for pain.     No current facility-administered medications for this visit.     ROS:   General:  No weight loss, Fever, chills  HEENT: No recent headaches, no nasal bleeding, no visual changes, no sore throat  Neurologic: No dizziness, blackouts, seizures. No recent symptoms of stroke or mini- stroke. No recent episodes of slurred speech, or temporary blindness.  Cardiac: No recent episodes of chest pain/pressure, no shortness of breath at rest.  No shortness of breath with exertion.   Denies history of atrial fibrillation or irregular heartbeat  Vascular: No history of rest pain in feet.  No history of claudication.  No history of non-healing ulcer, No history of DVT   Pulmonary: No home oxygen, no productive cough, no hemoptysis,  No asthma or wheezing  Musculoskeletal:  [ ]  Arthritis, [ ]  Low back pain,  [ ]  Joint pain  Hematologic:No history of hypercoagulable state.  No history of easy bleeding.  No history of anemia  Gastrointestinal: No hematochezia or melena,  No gastroesophageal reflux, no trouble swallowing  Urinary: [ ]  chronic Kidney disease, [ ]  on HD - [ ]  MWF or [ ]  TTHS, [ ]  Burning with urination, [ ]  Frequent urination, [ ]  Difficulty urinating;   Skin: No rashes  Psychological: No history of anxiety,  No history of depression   Physical Examination  Vitals:   01/23/18 1208  BP: (!) 141/81  Pulse: (!) 53  Resp: 18  SpO2: 99%  Weight: 121 lb (54.9 kg)  Height: 5\' 3"  (1.6 m)    Body mass index is 21.43 kg/m.  General:  Alert and oriented, no acute distress HEENT: Normal Neck: No bruit or JVD Pulmonary: Clear to auscultation bilaterally Cardiac: Regular Rate and Rhythm without murmur Abdomen: Soft, non-tender, non-distended, no mass, no scars Skin: No rash Extremity Pulses:  2+ radial, brachial, femoral, dorsalis pedis, posterior tibial pulses bilaterally Musculoskeletal: No deformity or edema  Neurologic: Upper and lower extremity motor 5/5 and symmetric        DATA:  Had a venous duplex ultrasound of her right leg today.  This showed reflux diffusely throughout the right greater saphenous vein.  Vein diameter was 4 to 5 mm.  She did also have some right common femoral vein reflux.  There was no evidence of DVT.  I reviewed and interpreted the study.  I also performed a SonoSite exam at the bedside which confirmed the above findings.  ASSESSMENT: Symptomatic varicose veins with pain right leg.  CEAP class II   PLAN:  Patient was given a prescription today for long leg compression stockings.  She will follow-up in 3 months time to she had she has improvement in her symptoms and consideration for laser ablation.   Fabienne Bruns, MD [facility] Office: [phone] Pager: [phone]

## 2018-02-07 DIAGNOSIS — K045 Chronic apical periodontitis: Secondary | ICD-10-CM | POA: Diagnosis not present

## 2018-05-15 ENCOUNTER — Other Ambulatory Visit: Payer: Self-pay

## 2018-05-15 ENCOUNTER — Encounter: Payer: Self-pay | Admitting: Vascular Surgery

## 2018-05-15 ENCOUNTER — Ambulatory Visit: Payer: BLUE CROSS/BLUE SHIELD | Admitting: Vascular Surgery

## 2018-05-15 ENCOUNTER — Ambulatory Visit (INDEPENDENT_AMBULATORY_CARE_PROVIDER_SITE_OTHER): Payer: BLUE CROSS/BLUE SHIELD | Admitting: Vascular Surgery

## 2018-05-15 VITALS — BP 160/89 | HR 65 | Temp 97.0°F | Resp 18 | Ht 63.0 in | Wt 120.0 lb

## 2018-05-15 DIAGNOSIS — I83811 Varicose veins of right lower extremities with pain: Secondary | ICD-10-CM

## 2018-05-15 NOTE — Progress Notes (Signed)
Patient is a 58 year old female who returns for further follow-up today.  She was last seen January 23, 2018.  That time she underwent evaluation for symptomatic varicose veins in her right leg.  Her primary complaints at that time were related to aching around a cluster of varicosities on the medial aspect of her right leg.  She states this gets progressively worse after working all day.  Her legs are usually better in the morning time.  She has been wearing compression stockings since October and has had some symptomatic relief but not complete resolution of her symptoms.  She also thinks that the varicosities in the right leg are slowly worsening over time.  She denies prior history of DVT.  She does have a family history of varicose veins in her brother and sister.  She has been compliant wearing her compression stockings for the last 3 months.  Past Medical History:  Diagnosis Date  . High cholesterol   . Hypertension     Review of systems: She denies shortness of breath.  She denies chest pain.  Social History   Socioeconomic History  . Marital status: Married    Spouse name: Not on file  . Number of children: Not on file  . Years of education: Not on file  . Highest education level: Not on file  Occupational History  . Not on file  Social Needs  . Financial resource strain: Not on file  . Food insecurity:    Worry: Not on file    Inability: Not on file  . Transportation needs:    Medical: Not on file    Non-medical: Not on file  Tobacco Use  . Smoking status: Former Smoker    Types: Cigarettes    Last attempt to quit: 04/2007    Years since quitting: 11.0  . Smokeless tobacco: Never Used  . Tobacco comment: Not everyday   Substance and Sexual Activity  . Alcohol use: No  . Drug use: No  . Sexual activity: Yes    Birth control/protection: None  Lifestyle  . Physical activity:    Days per week: Not on file    Minutes per session: Not on file  . Stress: Not on file   Relationships  . Social connections:    Talks on phone: Not on file    Gets together: Not on file    Attends religious service: Not on file    Active member of club or organization: Not on file    Attends meetings of clubs or organizations: Not on file    Relationship status: Not on file  . Intimate partner violence:    Fear of current or ex partner: Not on file    Emotionally abused: Not on file    Physically abused: Not on file    Forced sexual activity: Not on file  Other Topics Concern  . Not on file  Social History Narrative   Lives at home with husband.    Family History  Problem Relation Age of Onset  . High blood pressure Mother   . Hyperlipidemia Mother   . High blood pressure Father   . Leukemia Father     Current Outpatient Medications on File Prior to Visit  Medication Sig Dispense Refill  . acetaminophen (TYLENOL) 500 MG tablet Take 500 mg by mouth every 6 (six) hours as needed for pain.    Marland Kitchen atorvastatin (LIPITOR) 20 MG tablet Take 20 mg by mouth daily.    . captopril (CAPOTEN)  25 MG tablet Take 25 mg by mouth daily.    . metoprolol succinate (TOPROL-XL) 25 MG 24 hr tablet Take by mouth.     No current facility-administered medications on file prior to visit.     No Known Allergies  Physical exam:  Vitals:   05/15/18 1408  BP: (!) 160/89  Pulse: 65  Resp: 18  Temp: (!) 97 F (36.1 C)  TempSrc: Oral  SpO2: 99%  Weight: 120 lb (54.4 kg)  Height: 5\' 3"  (1.6 m)   Extremities: 2+ dorsalis pedis pulse bilaterally  Skin: Multiple varicosities inner aspect right leg extending onto the right pretibial region similar to those depicted in her chart on January 23, 2018.  Data: I again performed a SonoSite exam on the patient today at the bedside.  This again confirms a 4 to 5 mm right greater saphenous vein.  Previous duplex ultrasound showed reflux throughout the right greater saphenous vein.  There also was some mild right common femoral vein  reflux.  Assessment: Symptomatic varicose veins with pain right leg doubt resolution of symptoms despite compression therapy.  CEAP class II.  Plan: The patient will continue to wear her lower extremity compression stockings to improve symptoms.  We will see if we can get her approved for laser ablation and stab avulsions in the near future.  Benefits possible complications of procedure details were discussed with the patient regarding this today.  Fabienne Brunsharles Fields, MD [facility] Office: [phone] Pager: [phone]

## 2018-07-18 DIAGNOSIS — Z85828 Personal history of other malignant neoplasm of skin: Secondary | ICD-10-CM | POA: Diagnosis not present

## 2018-07-18 DIAGNOSIS — D1801 Hemangioma of skin and subcutaneous tissue: Secondary | ICD-10-CM | POA: Diagnosis not present

## 2018-07-18 DIAGNOSIS — L821 Other seborrheic keratosis: Secondary | ICD-10-CM | POA: Diagnosis not present

## 2018-07-18 DIAGNOSIS — L570 Actinic keratosis: Secondary | ICD-10-CM | POA: Diagnosis not present

## 2018-07-18 DIAGNOSIS — L718 Other rosacea: Secondary | ICD-10-CM | POA: Diagnosis not present

## 2018-10-24 DIAGNOSIS — E7800 Pure hypercholesterolemia, unspecified: Secondary | ICD-10-CM | POA: Diagnosis not present

## 2018-10-24 DIAGNOSIS — R809 Proteinuria, unspecified: Secondary | ICD-10-CM | POA: Diagnosis not present

## 2018-10-24 DIAGNOSIS — I129 Hypertensive chronic kidney disease with stage 1 through stage 4 chronic kidney disease, or unspecified chronic kidney disease: Secondary | ICD-10-CM | POA: Diagnosis not present

## 2018-10-24 DIAGNOSIS — Z0000 Encounter for general adult medical examination without abnormal findings: Secondary | ICD-10-CM | POA: Diagnosis not present

## 2018-11-01 DIAGNOSIS — M859 Disorder of bone density and structure, unspecified: Secondary | ICD-10-CM | POA: Diagnosis not present

## 2018-11-01 DIAGNOSIS — I129 Hypertensive chronic kidney disease with stage 1 through stage 4 chronic kidney disease, or unspecified chronic kidney disease: Secondary | ICD-10-CM | POA: Diagnosis not present

## 2018-11-01 DIAGNOSIS — E7800 Pure hypercholesterolemia, unspecified: Secondary | ICD-10-CM | POA: Diagnosis not present

## 2018-11-01 DIAGNOSIS — Z5181 Encounter for therapeutic drug level monitoring: Secondary | ICD-10-CM | POA: Diagnosis not present

## 2018-12-03 DIAGNOSIS — Z1211 Encounter for screening for malignant neoplasm of colon: Secondary | ICD-10-CM | POA: Diagnosis not present

## 2018-12-11 DIAGNOSIS — L570 Actinic keratosis: Secondary | ICD-10-CM | POA: Diagnosis not present

## 2018-12-11 DIAGNOSIS — D485 Neoplasm of uncertain behavior of skin: Secondary | ICD-10-CM | POA: Diagnosis not present

## 2018-12-11 DIAGNOSIS — Z85828 Personal history of other malignant neoplasm of skin: Secondary | ICD-10-CM | POA: Diagnosis not present

## 2018-12-11 DIAGNOSIS — L821 Other seborrheic keratosis: Secondary | ICD-10-CM | POA: Diagnosis not present

## 2019-01-07 DIAGNOSIS — U071 COVID-19: Secondary | ICD-10-CM | POA: Diagnosis not present

## 2019-01-07 DIAGNOSIS — Z03818 Encounter for observation for suspected exposure to other biological agents ruled out: Secondary | ICD-10-CM | POA: Diagnosis not present

## 2019-01-21 DIAGNOSIS — Z20828 Contact with and (suspected) exposure to other viral communicable diseases: Secondary | ICD-10-CM | POA: Diagnosis not present

## 2019-03-06 DIAGNOSIS — Z23 Encounter for immunization: Secondary | ICD-10-CM | POA: Diagnosis not present

## 2019-03-06 DIAGNOSIS — H9202 Otalgia, left ear: Secondary | ICD-10-CM | POA: Diagnosis not present

## 2019-03-31 DIAGNOSIS — L570 Actinic keratosis: Secondary | ICD-10-CM | POA: Diagnosis not present

## 2019-04-21 ENCOUNTER — Ambulatory Visit: Payer: BC Managed Care – PPO | Attending: Internal Medicine

## 2019-04-21 DIAGNOSIS — Z20822 Contact with and (suspected) exposure to covid-19: Secondary | ICD-10-CM

## 2019-04-21 DIAGNOSIS — Z20828 Contact with and (suspected) exposure to other viral communicable diseases: Secondary | ICD-10-CM | POA: Diagnosis not present

## 2019-04-22 ENCOUNTER — Encounter: Payer: Self-pay | Admitting: Vascular Surgery

## 2019-04-23 LAB — NOVEL CORONAVIRUS, NAA: SARS-CoV-2, NAA: NOT DETECTED

## 2019-05-01 DIAGNOSIS — I129 Hypertensive chronic kidney disease with stage 1 through stage 4 chronic kidney disease, or unspecified chronic kidney disease: Secondary | ICD-10-CM | POA: Diagnosis not present

## 2019-05-01 DIAGNOSIS — E7800 Pure hypercholesterolemia, unspecified: Secondary | ICD-10-CM | POA: Diagnosis not present

## 2019-05-01 DIAGNOSIS — M8580 Other specified disorders of bone density and structure, unspecified site: Secondary | ICD-10-CM | POA: Diagnosis not present

## 2019-05-01 DIAGNOSIS — R809 Proteinuria, unspecified: Secondary | ICD-10-CM | POA: Diagnosis not present

## 2019-05-05 DIAGNOSIS — Z1159 Encounter for screening for other viral diseases: Secondary | ICD-10-CM | POA: Diagnosis not present

## 2019-05-07 DIAGNOSIS — I129 Hypertensive chronic kidney disease with stage 1 through stage 4 chronic kidney disease, or unspecified chronic kidney disease: Secondary | ICD-10-CM | POA: Diagnosis not present

## 2019-05-07 DIAGNOSIS — E7800 Pure hypercholesterolemia, unspecified: Secondary | ICD-10-CM | POA: Diagnosis not present

## 2019-05-07 DIAGNOSIS — Z5181 Encounter for therapeutic drug level monitoring: Secondary | ICD-10-CM | POA: Diagnosis not present

## 2019-05-08 DIAGNOSIS — Z1211 Encounter for screening for malignant neoplasm of colon: Secondary | ICD-10-CM | POA: Diagnosis not present

## 2019-05-27 DIAGNOSIS — Z20822 Contact with and (suspected) exposure to covid-19: Secondary | ICD-10-CM | POA: Diagnosis not present

## 2019-06-16 DIAGNOSIS — D0472 Carcinoma in situ of skin of left lower limb, including hip: Secondary | ICD-10-CM | POA: Diagnosis not present

## 2019-06-16 DIAGNOSIS — L570 Actinic keratosis: Secondary | ICD-10-CM | POA: Diagnosis not present

## 2019-06-16 DIAGNOSIS — Z85828 Personal history of other malignant neoplasm of skin: Secondary | ICD-10-CM | POA: Diagnosis not present

## 2019-06-16 DIAGNOSIS — L821 Other seborrheic keratosis: Secondary | ICD-10-CM | POA: Diagnosis not present

## 2019-06-16 DIAGNOSIS — D225 Melanocytic nevi of trunk: Secondary | ICD-10-CM | POA: Diagnosis not present

## 2019-06-16 DIAGNOSIS — D485 Neoplasm of uncertain behavior of skin: Secondary | ICD-10-CM | POA: Diagnosis not present

## 2019-06-30 ENCOUNTER — Ambulatory Visit: Payer: BC Managed Care – PPO | Attending: Internal Medicine

## 2019-06-30 DIAGNOSIS — Z23 Encounter for immunization: Secondary | ICD-10-CM

## 2019-06-30 NOTE — Progress Notes (Signed)
   Covid-19 Vaccination Clinic  Name:  Shelly Tran    MRN: [id number] DOB: April 27, 1960  06/30/2019  [name] was observed post Covid-19 immunization for 15 minutes without incident. She was provided with Vaccine Information Sheet and instruction to access the V-Safe system.   [name] was instructed to call 911 with any severe reactions post vaccine: Marland Kitchen Difficulty breathing  . Swelling of face and throat  . A fast heartbeat  . A bad rash all over body  . Dizziness and weakness   Immunizations Administered    Name Date Dose VIS Date Route   Pfizer COVID-19 Vaccine 06/30/2019  8:24 AM 0.3 mL 04/04/2019 Intramuscular   Manufacturer: [employer], [employer]   Lot: [id number]   NDC: [address]-0

## 2019-07-28 DIAGNOSIS — C44729 Squamous cell carcinoma of skin of left lower limb, including hip: Secondary | ICD-10-CM | POA: Diagnosis not present

## 2019-07-30 ENCOUNTER — Ambulatory Visit: Payer: BC Managed Care – PPO

## 2019-08-11 ENCOUNTER — Ambulatory Visit: Payer: BC Managed Care – PPO | Attending: Internal Medicine

## 2019-08-11 DIAGNOSIS — Z23 Encounter for immunization: Secondary | ICD-10-CM

## 2019-08-11 NOTE — Progress Notes (Signed)
   Covid-19 Vaccination Clinic  Name:  Shelly Tran    MRN: [id number] DOB: 05-29-1960  08/11/2019  Ms. Thorp was observed post Covid-19 immunization for 15 minutes without incident. She was provided with Vaccine Information Sheet and instruction to access the V-Safe system.   Ms. Men was instructed to call 911 with any severe reactions post vaccine: Marland Kitchen Difficulty breathing  . Swelling of face and throat  . A fast heartbeat  . A bad rash all over body  . Dizziness and weakness   Immunizations Administered    Name Date Dose VIS Date Route   Pfizer COVID-19 Vaccine 08/11/2019 12:56 PM 0.3 mL 06/18/2018 Intramuscular   Manufacturer: [employer], [employer]   Lot: [id number]   NDC: [address]-1

## 2019-09-16 DIAGNOSIS — Z01419 Encounter for gynecological examination (general) (routine) without abnormal findings: Secondary | ICD-10-CM | POA: Diagnosis not present

## 2019-09-16 DIAGNOSIS — N3281 Overactive bladder: Secondary | ICD-10-CM | POA: Diagnosis not present

## 2019-09-16 DIAGNOSIS — N3946 Mixed incontinence: Secondary | ICD-10-CM | POA: Diagnosis not present

## 2019-09-16 DIAGNOSIS — Z6821 Body mass index (BMI) 21.0-21.9, adult: Secondary | ICD-10-CM | POA: Diagnosis not present

## 2019-10-21 DIAGNOSIS — C44729 Squamous cell carcinoma of skin of left lower limb, including hip: Secondary | ICD-10-CM | POA: Diagnosis not present

## 2019-10-29 DIAGNOSIS — Z1231 Encounter for screening mammogram for malignant neoplasm of breast: Secondary | ICD-10-CM | POA: Diagnosis not present

## 2019-10-29 DIAGNOSIS — M810 Age-related osteoporosis without current pathological fracture: Secondary | ICD-10-CM | POA: Diagnosis not present

## 2019-11-21 DIAGNOSIS — I129 Hypertensive chronic kidney disease with stage 1 through stage 4 chronic kidney disease, or unspecified chronic kidney disease: Secondary | ICD-10-CM | POA: Diagnosis not present

## 2019-11-21 DIAGNOSIS — R809 Proteinuria, unspecified: Secondary | ICD-10-CM | POA: Diagnosis not present

## 2019-11-21 DIAGNOSIS — Z0000 Encounter for general adult medical examination without abnormal findings: Secondary | ICD-10-CM | POA: Diagnosis not present

## 2019-11-21 DIAGNOSIS — E7800 Pure hypercholesterolemia, unspecified: Secondary | ICD-10-CM | POA: Diagnosis not present

## 2019-12-15 DIAGNOSIS — L570 Actinic keratosis: Secondary | ICD-10-CM | POA: Diagnosis not present

## 2019-12-15 DIAGNOSIS — L821 Other seborrheic keratosis: Secondary | ICD-10-CM | POA: Diagnosis not present

## 2019-12-15 DIAGNOSIS — Z85828 Personal history of other malignant neoplasm of skin: Secondary | ICD-10-CM | POA: Diagnosis not present

## 2019-12-15 DIAGNOSIS — L814 Other melanin hyperpigmentation: Secondary | ICD-10-CM | POA: Diagnosis not present

## 2020-04-13 DIAGNOSIS — C44329 Squamous cell carcinoma of skin of other parts of face: Secondary | ICD-10-CM | POA: Diagnosis not present

## 2020-05-05 DIAGNOSIS — Z20822 Contact with and (suspected) exposure to covid-19: Secondary | ICD-10-CM | POA: Diagnosis not present

## 2020-05-25 DIAGNOSIS — M810 Age-related osteoporosis without current pathological fracture: Secondary | ICD-10-CM | POA: Diagnosis not present

## 2020-05-25 DIAGNOSIS — I129 Hypertensive chronic kidney disease with stage 1 through stage 4 chronic kidney disease, or unspecified chronic kidney disease: Secondary | ICD-10-CM | POA: Diagnosis not present

## 2020-05-25 DIAGNOSIS — E7800 Pure hypercholesterolemia, unspecified: Secondary | ICD-10-CM | POA: Diagnosis not present

## 2020-05-25 DIAGNOSIS — R809 Proteinuria, unspecified: Secondary | ICD-10-CM | POA: Diagnosis not present

## 2020-06-16 DIAGNOSIS — Z85828 Personal history of other malignant neoplasm of skin: Secondary | ICD-10-CM | POA: Diagnosis not present

## 2020-06-16 DIAGNOSIS — L570 Actinic keratosis: Secondary | ICD-10-CM | POA: Diagnosis not present

## 2020-06-16 DIAGNOSIS — D692 Other nonthrombocytopenic purpura: Secondary | ICD-10-CM | POA: Diagnosis not present

## 2020-06-16 DIAGNOSIS — L814 Other melanin hyperpigmentation: Secondary | ICD-10-CM | POA: Diagnosis not present

## 2020-06-29 ENCOUNTER — Ambulatory Visit (HOSPITAL_COMMUNITY)
Admission: EM | Admit: 2020-06-29 | Discharge: 2020-06-29 | Disposition: A | Discharge status: Home / Self Care | Payer: BC Managed Care – PPO | Attending: Student | Admitting: Student

## 2020-06-29 ENCOUNTER — Other Ambulatory Visit: Payer: Self-pay

## 2020-06-29 DIAGNOSIS — H00015 Hordeolum externum left lower eyelid: Secondary | ICD-10-CM | POA: Diagnosis not present

## 2020-06-29 MED ORDER — ERYTHROMYCIN 5 MG/GM OP OINT: TOPICAL_OINTMENT | OPHTHALMIC | 0 refills | Status: DC

## 2020-06-29 NOTE — Discharge Instructions (Addendum)
-  Use the erythromycin ointment as directed -Continue warm compresses 1-2x daily. -Follow-up with your eye doctor if your symptoms worsen/persist -Seek immediate medical attention if you develop vision changes, eye pain with movement, etc

## 2020-06-29 NOTE — ED Provider Notes (Signed)
[facility]    CSN: [id number] Arrival date & time: 06/29/20  1153      History   Chief Complaint Chief Complaint  Patient presents with  . Eye Pain    HPI Shelly Tran is a 60 y.o. female presenting with left eye stye. History hypertension, hyperlipidemia, smoking.  States she first noticed swelling of the lower eyelid 3 days ago.  Since then it has gotten progressively worse and more painful. Has been using warm compresses without improvement. States she had a stye in the past, and so she wanted to come in before this got any worse.  Denies vision changes, photophobia, discharge, itching, burning, eye pain with movement, cough, congestion. Wears reading glasses. History lens implant 6 years ago.  HPI  Past Medical History:  Diagnosis Date  . High cholesterol   . Hypertension     Patient Active Problem List   Diagnosis Date Noted  . Chest pain, unspecified 10/10/2012  . Hypertension 10/09/2012  . Hyperlipidemia 10/09/2012  . Active smoker 10/09/2012    Past Surgical History:  Procedure Laterality Date  . ABDOMINAL HYSTERECTOMY    . ELBOW SURGERY     right  . SHOULDER SURGERY     bilateral arthroscopic    OB History   No obstetric history on file.      Home Medications    Prior to Admission medications   Medication Sig Start Date End Date Taking? Authorizing Provider  erythromycin ophthalmic ointment Place a 1/2 inch ribbon of ointment into the lower eyelid at bedtime for 7 nights in a row 06/29/20  Yes Rhys Martini, PA-C  acetaminophen (TYLENOL) 500 MG tablet Take 500 mg by mouth every 6 (six) hours as needed for pain.    [provider]  atorvastatin (LIPITOR) 20 MG tablet Take 20 mg by mouth daily.    [provider]  captopril (CAPOTEN) 25 MG tablet Take 25 mg by mouth daily.    [provider]  metoprolol succinate (TOPROL-XL) 25 MG 24 hr tablet Take by mouth.    [provider]    Family History Family  History  Problem Relation Age of Onset  . High blood pressure Mother   . Hyperlipidemia Mother   . High blood pressure Father   . Leukemia Father     Social History Social History   Tobacco Use  . Smoking status: Former Smoker    Types: Cigarettes    Quit date: 04/2007    Years since quitting: 13.1  . Smokeless tobacco: Never Used  . Tobacco comment: Not everyday   Vaping Use  . Vaping Use: Never used  Substance Use Topics  . Alcohol use: No  . Drug use: No     Allergies   Patient has no known allergies.   Review of Systems Review of Systems  HENT: Negative for congestion.   Eyes: Negative for photophobia, pain, discharge, redness, itching and visual disturbance.  Respiratory: Negative for cough.   All other systems reviewed and are negative.    Physical Exam Triage Vital Signs ED Triage Vitals [06/29/20 1220]  Enc Vitals Group     BP      Pulse      Resp      Temp      Temp src      SpO2      Weight      Height      Head Circumference      Peak Flow  Pain Score 2     Pain Loc      Pain Edu?      Excl. in GC?    No data found.  Updated Vital Signs BP (!) 151/83 (BP Location: Right Arm)   Pulse (!) 58   Temp 98.3 F (36.8 C) (Oral)   Resp 16   SpO2 94%   Visual Acuity Right Eye Distance: 20/40 Left Eye Distance: 20/40 Bilateral Distance: 20/50  Right Eye Near:   Left Eye Near:    Bilateral Near:     Physical Exam Vitals reviewed.  Constitutional:      Appearance: Normal appearance.  HENT:     Head: Normocephalic and atraumatic.  Eyes:     General: Lids are everted, no foreign bodies appreciated. Vision grossly intact. Gaze aligned appropriately. No visual field deficit.       Right eye: No foreign body, discharge or hordeolum.        Left eye: Hordeolum present.No foreign body or discharge.     Extraocular Movements: Extraocular movements intact.     Right eye: Normal extraocular motion and no nystagmus.     Left eye: Normal  extraocular motion and no nystagmus.     Conjunctiva/sclera:     Right eye: Right conjunctiva is not injected. No chemosis, exudate or hemorrhage.    Left eye: Left conjunctiva is not injected. No chemosis, exudate or hemorrhage.    Pupils: Pupils are equal, round, and reactive to light.     Visual Fields: Right eye visual fields normal and left eye visual fields normal.  Cardiovascular:     Rate and Rhythm: Normal rate and regular rhythm.     Heart sounds: Normal heart sounds.  Pulmonary:     Effort: Pulmonary effort is normal.     Breath sounds: Normal breath sounds.  Neurological:     General: No focal deficit present.     Mental Status: She is alert and oriented to person, place, and time.  Psychiatric:        Mood and Affect: Mood normal.        Behavior: Behavior normal.        Thought Content: Thought content normal.        Judgment: Judgment normal.      UC Treatments / Results  Labs (all labs ordered are listed, but only abnormal results are displayed) Labs Reviewed - No data to display  EKG   Radiology No results found.  Procedures Procedures (including critical care time)  Medications Ordered in UC Medications - No data to display  Initial Impression / Assessment and Plan / UC Course  I have reviewed the triage vital signs and the nursing notes.  Pertinent labs & imaging results that were available during my care of the patient were reviewed by me and considered in my medical decision making (see chart for details).      This patient is a 60 year old female presenting with left lower eyelid stye. Visual acuity intact. History of lens implant.  Wears reading glasses. Plan to treat with erythromycin ointment and warm compresses as below. Return precautions discussed  This chart was dictated using voice recognition software, Dragon. Despite the best efforts of this provider to proofread and correct errors, errors may still occur which can change  documentation meaning.   Final Clinical Impressions(s) / UC Diagnoses   Final diagnoses:  Hordeolum externum of left lower eyelid     Discharge Instructions     -Use the erythromycin  ointment as directed -Continue warm compresses 1-2x daily. -Follow-up with your eye doctor if your symptoms worsen/persist -Seek immediate medical attention if you develop vision changes, eye pain with movement, etc    ED Prescriptions    Medication Sig Dispense Auth. Provider   erythromycin ophthalmic ointment Place a 1/2 inch ribbon of ointment into the lower eyelid at bedtime for 7 nights in a row 3.5 g Rhys Martini, PA-C     PDMP not reviewed this encounter.   Rhys Martini, PA-C 06/29/20 1245

## 2020-06-29 NOTE — ED Triage Notes (Signed)
Pt reports Pain to Lt lower eye lid. Pt thinks she has a stye .

## 2020-08-09 DIAGNOSIS — H0288A Meibomian gland dysfunction right eye, upper and lower eyelids: Secondary | ICD-10-CM | POA: Diagnosis not present

## 2020-08-09 DIAGNOSIS — H524 Presbyopia: Secondary | ICD-10-CM | POA: Diagnosis not present

## 2020-08-09 DIAGNOSIS — H1045 Other chronic allergic conjunctivitis: Secondary | ICD-10-CM | POA: Diagnosis not present

## 2020-08-09 DIAGNOSIS — H5213 Myopia, bilateral: Secondary | ICD-10-CM | POA: Diagnosis not present

## 2020-08-09 DIAGNOSIS — Z961 Presence of intraocular lens: Secondary | ICD-10-CM | POA: Diagnosis not present

## 2020-08-09 DIAGNOSIS — H0288B Meibomian gland dysfunction left eye, upper and lower eyelids: Secondary | ICD-10-CM | POA: Diagnosis not present

## 2020-08-09 DIAGNOSIS — H52202 Unspecified astigmatism, left eye: Secondary | ICD-10-CM | POA: Diagnosis not present

## 2020-09-23 DIAGNOSIS — L570 Actinic keratosis: Secondary | ICD-10-CM | POA: Diagnosis not present

## 2020-09-23 DIAGNOSIS — D0471 Carcinoma in situ of skin of right lower limb, including hip: Secondary | ICD-10-CM | POA: Diagnosis not present

## 2020-10-05 DIAGNOSIS — Z90710 Acquired absence of both cervix and uterus: Secondary | ICD-10-CM | POA: Diagnosis not present

## 2020-10-05 DIAGNOSIS — Z01419 Encounter for gynecological examination (general) (routine) without abnormal findings: Secondary | ICD-10-CM | POA: Diagnosis not present

## 2020-10-05 DIAGNOSIS — M810 Age-related osteoporosis without current pathological fracture: Secondary | ICD-10-CM | POA: Diagnosis not present

## 2020-10-05 DIAGNOSIS — I10 Essential (primary) hypertension: Secondary | ICD-10-CM | POA: Diagnosis not present

## 2020-10-21 DIAGNOSIS — C44622 Squamous cell carcinoma of skin of right upper limb, including shoulder: Secondary | ICD-10-CM | POA: Diagnosis not present

## 2020-10-21 DIAGNOSIS — C44529 Squamous cell carcinoma of skin of other part of trunk: Secondary | ICD-10-CM | POA: Diagnosis not present

## 2020-11-03 DIAGNOSIS — Z1231 Encounter for screening mammogram for malignant neoplasm of breast: Secondary | ICD-10-CM | POA: Diagnosis not present

## 2020-12-16 DIAGNOSIS — L570 Actinic keratosis: Secondary | ICD-10-CM | POA: Diagnosis not present

## 2020-12-16 DIAGNOSIS — D225 Melanocytic nevi of trunk: Secondary | ICD-10-CM | POA: Diagnosis not present

## 2020-12-16 DIAGNOSIS — L821 Other seborrheic keratosis: Secondary | ICD-10-CM | POA: Diagnosis not present

## 2020-12-16 DIAGNOSIS — Z85828 Personal history of other malignant neoplasm of skin: Secondary | ICD-10-CM | POA: Diagnosis not present

## 2020-12-16 DIAGNOSIS — C44729 Squamous cell carcinoma of skin of left lower limb, including hip: Secondary | ICD-10-CM | POA: Diagnosis not present

## 2021-02-04 DIAGNOSIS — E7800 Pure hypercholesterolemia, unspecified: Secondary | ICD-10-CM | POA: Diagnosis not present

## 2021-02-04 DIAGNOSIS — K219 Gastro-esophageal reflux disease without esophagitis: Secondary | ICD-10-CM | POA: Diagnosis not present

## 2021-02-04 DIAGNOSIS — R809 Proteinuria, unspecified: Secondary | ICD-10-CM | POA: Diagnosis not present

## 2021-02-04 DIAGNOSIS — Z0000 Encounter for general adult medical examination without abnormal findings: Secondary | ICD-10-CM | POA: Diagnosis not present

## 2021-02-04 DIAGNOSIS — I129 Hypertensive chronic kidney disease with stage 1 through stage 4 chronic kidney disease, or unspecified chronic kidney disease: Secondary | ICD-10-CM | POA: Diagnosis not present

## 2021-02-04 DIAGNOSIS — Z23 Encounter for immunization: Secondary | ICD-10-CM | POA: Diagnosis not present

## 2021-04-13 DIAGNOSIS — L820 Inflamed seborrheic keratosis: Secondary | ICD-10-CM | POA: Diagnosis not present

## 2021-04-13 DIAGNOSIS — D485 Neoplasm of uncertain behavior of skin: Secondary | ICD-10-CM | POA: Diagnosis not present

## 2021-06-15 ENCOUNTER — Other Ambulatory Visit: Payer: Self-pay

## 2021-06-15 DIAGNOSIS — I8391 Asymptomatic varicose veins of right lower extremity: Secondary | ICD-10-CM

## 2021-06-17 ENCOUNTER — Encounter: Payer: Self-pay | Admitting: Vascular Surgery

## 2021-06-17 ENCOUNTER — Ambulatory Visit (HOSPITAL_COMMUNITY)
Admission: RE | Admit: 2021-06-17 | Discharge: 2021-06-17 | Disposition: A | Discharge status: Home / Self Care | Payer: BC Managed Care – PPO | Source: Ambulatory Visit | Attending: Vascular Surgery | Admitting: Vascular Surgery

## 2021-06-17 ENCOUNTER — Ambulatory Visit (INDEPENDENT_AMBULATORY_CARE_PROVIDER_SITE_OTHER): Payer: BC Managed Care – PPO | Admitting: Vascular Surgery

## 2021-06-17 ENCOUNTER — Other Ambulatory Visit: Payer: Self-pay

## 2021-06-17 VITALS — BP 144/90 | HR 57 | Temp 98.0°F | Resp 20 | Ht 63.0 in | Wt 125.0 lb

## 2021-06-17 DIAGNOSIS — I8391 Asymptomatic varicose veins of right lower extremity: Secondary | ICD-10-CM | POA: Diagnosis not present

## 2021-06-17 DIAGNOSIS — I8393 Asymptomatic varicose veins of bilateral lower extremities: Secondary | ICD-10-CM

## 2021-06-17 DIAGNOSIS — I872 Venous insufficiency (chronic) (peripheral): Secondary | ICD-10-CM

## 2021-06-17 NOTE — Progress Notes (Signed)
Office Note     CC: Right leg varicosities with associated pain Requesting Provider:  Maurice Small, MD  HPI: Shelly Tran is a 61 y.o. (08-May-1960) female who presents at the request of Maurice Small, MD for evaluation of worsening right lower extremity varicosities, increased pain and heaviness in the right lower extremity.  Patient was seen in 2020 by Dr. Darrick Penna, and noted to have this insufficiency in the right visual venous system.  At the time, her symptoms were mild, therefore she elected to continue with compression therapy.  Since that time, her varicosities have worsened, and she now notes pain in the leg by days end.   Exam today, Shella was doing well.  She had worsening of right lower extremity varicosities with new onset heaviness and pain in the right leg by days end.  She has been inconsistent with compression stocking since seen last.  Denies ulceration, bleeding events.  He continues to live an active lifestyle swimming weekly, working out daily.  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 Kiaya does not have any children.  He does not use pain medications.  No history of previous DVT, no previous vein procedures.  Past Medical History:  Diagnosis Date   High cholesterol    Hypertension     Past Surgical History:  Procedure Laterality Date   ABDOMINAL HYSTERECTOMY     ELBOW SURGERY     right   SHOULDER SURGERY     bilateral arthroscopic    Social History   Socioeconomic History   Marital status: Married    Spouse name: Not on file   Number of children: Not on file   Years of education: Not on file   Highest education level: Not on file  Occupational History   Not on file  Tobacco Use   Smoking status: Former    Types: Cigarettes    Quit date: 04/2007    Years since quitting: 14.1   Smokeless tobacco: Never   Tobacco comments:    Not everyday   Vaping Use   Vaping Use: Never used  Substance and Sexual Activity   Alcohol use: No    Drug use: No   Sexual activity: Yes    Birth control/protection: None  Other Topics Concern   Not on file  Social History Narrative   Lives at home with husband.   Social Determinants of Health   Financial Resource Strain: Not on file  Food Insecurity: Not on file  Transportation Needs: Not on file  Physical Activity: Not on file  Stress: Not on file  Social Connections: Not on file  Intimate Partner Violence: Not on file   Family History  Problem Relation Age of Onset   High blood pressure Mother    Hyperlipidemia Mother    High blood pressure Father    Leukemia Father     Current Outpatient Medications  Medication Sig Dispense Refill   acetaminophen (TYLENOL) 500 MG tablet Take 500 mg by mouth every 6 (six) hours as needed for pain.     atorvastatin (LIPITOR) 20 MG tablet Take 20 mg by mouth daily.     captopril (CAPOTEN) 25 MG tablet Take 25 mg by mouth daily.     ibandronate (BONIVA) 150 MG tablet 1 tablet     metoprolol succinate (TOPROL-XL) 25 MG 24 hr tablet Take by mouth.     No current facility-administered medications for this visit.    No Known Allergies   REVIEW OF SYSTEMS:   [  X] denotes positive finding, [ ]  denotes negative finding Cardiac  Comments:  Chest pain or chest pressure:    Shortness of breath upon exertion:    Short of breath when lying flat:    Irregular heart rhythm:        Vascular    Pain in calf, thigh, or hip brought on by ambulation:    Pain in feet at night that wakes you up from your sleep:     Blood clot in your veins:    Leg swelling:         Pulmonary    Oxygen at home:    Productive cough:     Wheezing:         Neurologic    Sudden weakness in arms or legs:     Sudden numbness in arms or legs:     Sudden onset of difficulty speaking or slurred speech:    Temporary loss of vision in one eye:     Problems with dizziness:         Gastrointestinal    Blood in stool:     Vomited blood:         Genitourinary     Burning when urinating:     Blood in urine:        Psychiatric    Major depression:         Hematologic    Bleeding problems:    Problems with blood clotting too easily:        Skin    Rashes or ulcers:        Constitutional    Fever or chills:      PHYSICAL EXAMINATION:  Vitals:   06/17/21 1336  BP: (!) 144/90  Pulse: (!) 57  Resp: 20  Temp: 98 F (36.7 C)  SpO2: 98%  Weight: 125 lb (56.7 kg)  Height: 5\' 3"  (1.6 m)    General:  WDWN in NAD; vital signs documented above Gait: Not observed HENT: WNL, normocephalic Pulmonary: normal non-labored breathing , without Rales, rhonchi,  wheezing Cardiac: regular HR Abdomen: soft, NT, no masses Skin: without rashes Vascular Exam/Pulses:  Right Left  Radial 2+ (normal) 2+ (normal)  Ulnar 2+ (normal) 2+ (normal)  Femoral    Popliteal    DP 2+ (normal) 2+ (normal)  PT 2+ (normal) 2+ (normal)   Extremities: without ischemic changes, without Gangrene , without cellulitis; without open wounds;  Musculoskeletal: no muscle wasting or atrophy  Neurologic: A&O X 3;  No focal weakness or paresthesias are detected Psychiatric:  The pt has Normal affect.   Non-Invasive Vascular Imaging:   Summary:  Right:  - No evidence of deep vein thrombosis seen in the right lower extremity,  from the common femoral through the popliteal veins.  - No evidence of superficial venous thrombosis in the right lower  extremity.     - Venous reflux is noted in the right common femoral vein.  - Venous reflux is noted in the right sapheno-femoral junction.  - Venous reflux is noted in the right greater saphenous vein in the thigh.  - Venous reflux is noted in the right greater saphenous vein in the calf.  - Venous reflux is noted in the right perforator veins (mid and distal  calf).     ASSESSMENT/PLAN:: 61 y.o. female presenting with progressive symptoms of venous insufficiency in the right leg.  Symptoms now include heaviness and pain by  days end.  I had a long discussion with [phone]  about the benefit of compression stockings.  She was measured in our office and will be wearing these daily for the next 3 months.  Should compression backings not provide benefit, she would be a candidate for greater saphenous vein laser ablation, stab phlebectomy. We discussed the risks and benefits of both this procedure and stab phlebectomy.  I will have her see my colleague Dr. Lemar Livings or Dr. Durene Cal in follow-up.     Victorino Sparrow, MD [facility] [phone]

## 2021-06-22 DIAGNOSIS — L821 Other seborrheic keratosis: Secondary | ICD-10-CM | POA: Diagnosis not present

## 2021-06-22 DIAGNOSIS — Z85828 Personal history of other malignant neoplasm of skin: Secondary | ICD-10-CM | POA: Diagnosis not present

## 2021-06-22 DIAGNOSIS — L570 Actinic keratosis: Secondary | ICD-10-CM | POA: Diagnosis not present

## 2021-06-22 DIAGNOSIS — L309 Dermatitis, unspecified: Secondary | ICD-10-CM | POA: Diagnosis not present

## 2021-06-22 DIAGNOSIS — L718 Other rosacea: Secondary | ICD-10-CM | POA: Diagnosis not present

## 2021-06-22 DIAGNOSIS — C44329 Squamous cell carcinoma of skin of other parts of face: Secondary | ICD-10-CM | POA: Diagnosis not present

## 2021-07-28 DIAGNOSIS — C44329 Squamous cell carcinoma of skin of other parts of face: Secondary | ICD-10-CM | POA: Diagnosis not present

## 2021-08-24 DIAGNOSIS — L820 Inflamed seborrheic keratosis: Secondary | ICD-10-CM | POA: Diagnosis not present

## 2021-10-03 ENCOUNTER — Encounter: Payer: Self-pay | Admitting: Surgery

## 2021-10-03 ENCOUNTER — Ambulatory Visit (INDEPENDENT_AMBULATORY_CARE_PROVIDER_SITE_OTHER): Payer: BC Managed Care – PPO | Admitting: Surgery

## 2021-10-03 VITALS — BP 143/83 | HR 55 | Temp 97.9°F | Resp 20 | Ht 63.0 in | Wt 127.0 lb

## 2021-10-03 DIAGNOSIS — I83893 Varicose veins of bilateral lower extremities with other complications: Secondary | ICD-10-CM | POA: Diagnosis not present

## 2021-10-03 NOTE — Progress Notes (Signed)
[facility] [place]  Patient name: Zienna Inboden MRN: [id number] DOB: 1960/08/02 Sex: female   REASON FOR VISIT:    Follow up  HISOTRY OF PRESENT ILLNESS:    Kason Carn is a 61 y.o. female returns today for follow-up of her right leg varicosities.  She first noticed these approximately 3 to 4 years ago following recovery from a skin cancer which got infected on her right leg.  She states that these have progressively gotten worse and does complain of pain particular at the end of a long day.  She has not had any bleeding episodes.  She does not get any significant swelling.  She denies any history of DVT.  She has not had any prior interventions.  She began wearing 20-30 thigh-high compression stockings 3 months ago.  She states that her pain and heaviness were improved with the compression socks.   PAST MEDICAL HISTORY:   Past Medical History:  Diagnosis Date   High cholesterol    Hypertension      FAMILY HISTORY:   Family History  Problem Relation Age of Onset   High blood pressure Mother    Hyperlipidemia Mother    High blood pressure Father    Leukemia Father     SOCIAL HISTORY:   Social History   Tobacco Use   Smoking status: Former    Types: Cigarettes    Quit date: 04/2007    Years since quitting: 14.4   Smokeless tobacco: Never   Tobacco comments:    Not everyday   Substance Use Topics   Alcohol use: No     ALLERGIES:   No Known Allergies   CURRENT MEDICATIONS:   Current Outpatient Medications  Medication Sig Dispense Refill   atorvastatin (LIPITOR) 20 MG tablet Take 20 mg by mouth daily.     captopril (CAPOTEN) 25 MG tablet Take 25 mg by mouth daily.     ibandronate (BONIVA) 150 MG tablet 1 tablet     metoprolol succinate (TOPROL-XL) 25 MG 24 hr tablet Take by mouth.     acetaminophen (TYLENOL) 500 MG tablet Take 500 mg by mouth every 6 (six) hours as needed for pain.     No current  facility-administered medications for this visit.    REVIEW OF SYSTEMS:   [X]  denotes positive finding, [ ]  denotes negative finding Cardiac  Comments:  Chest pain or chest pressure:    Shortness of breath upon exertion:    Short of breath when lying flat:    Irregular heart rhythm:        Vascular    Pain in calf, thigh, or hip brought on by ambulation:    Pain in feet at night that wakes you up from your sleep:     Blood clot in your veins:    Leg swelling:         Pulmonary    Oxygen at home:    Productive cough:     Wheezing:         Neurologic    Sudden weakness in arms or legs:     Sudden numbness in arms or legs:     Sudden onset of difficulty speaking or slurred speech:    Temporary loss of vision in one eye:     Problems with dizziness:         Gastrointestinal    Blood in stool:     Vomited blood:         Genitourinary  Burning when urinating:     Blood in urine:        Psychiatric    Major depression:         Hematologic    Bleeding problems:    Problems with blood clotting too easily:        Skin    Rashes or ulcers:        Constitutional    Fever or chills:      PHYSICAL EXAM:   Vitals:   10/03/21 0818  BP: (!) 143/83  Pulse: (!) 55  Resp: 20  Temp: 97.9 F (36.6 C)  SpO2: 98%  Weight: 127 lb (57.6 kg)  Height: 5\' 3"  (1.6 m)    GENERAL: The patient is a well-nourished female, in no acute distress. The vital signs are documented above. CARDIAC: There is a regular rate and rhythm.  VASCULAR: I use on the site to evaluate her right saphenous vein.  This is straight without significant tortuosity.  It is of adequate diameter.  There are numerous varicosities on the right leg. PULMONARY: Non-labored respirations MUSCULOSKELETAL: There are no major deformities or cyanosis. NEUROLOGIC: No focal weakness or paresthesias are detected. SKIN: See photo below. PSYCHIATRIC: The patient has a normal affect.   STUDIES:   I have reviewed  the following: Venous Reflux Times  +------------------+---------+------+----------+------------+--------------  ----+  RIGHT             Reflux NoReflux  Reflux  Diameter cmsComments                                          Yes     Time                                     +------------------+---------+------+----------+------------+--------------  ----+  CFV                         yes  >1 second             pulsatile  venous                                                           flow                  +------------------+---------+------+----------+------------+--------------  ----+  FV mid            no                                                         +------------------+---------+------+----------+------------+--------------  ----+  Popliteal         no                                                         +------------------+---------+------+----------+------------+--------------  ----+  GSV at [facility]                  yes   >500 ms      0.55    pulsatile  venous                                                           flow                  +------------------+---------+------+----------+------------+--------------  ----+  GSV prox thigh              yes   >500 ms      0.57                          +------------------+---------+------+----------+------------+--------------  ----+  GSV mid thigh               yes   >500 ms      0.56                          +------------------+---------+------+----------+------------+--------------  ----+  GSV dist thigh              yes   >500 ms      0.52                          +------------------+---------+------+----------+------------+--------------  ----+  GSV at knee                 yes   >500 ms      0.48                          +------------------+---------+------+----------+------------+--------------  ----+  GSV prox calf                yes   >500 ms      0.39                          +------------------+---------+------+----------+------------+--------------  ----+  SSV Pop Fossa                                  0.15                          +------------------+---------+------+----------+------------+--------------  ----+  SSV prox calf                                  0.13                          +------------------+---------+------+----------+------------+--------------  ----+  SSV mid calf                                   0.12                          +------------------+---------+------+----------+------------+--------------  ----+  Mid calf                    yes   >500 ms                                    perforator                                                                   +------------------+---------+------+----------+------------+--------------  ----+  Distal calf                 yes   >500 ms                                    perforator                                                                   +------------------+---------+------+----------+------------+--------------  ----+    MEDICAL ISSUES:   CEAP class II disease, right leg: She has painful prominent varicosities on the right leg with significant right great saphenous reflux.  I discussed proceeding with endovenous laser ablation of the right saphenous vein and greater than 20 stabs.  Details of the procedure were discussed with the patient.  All questions were answered.    Leia Alf, MD, FACS [facility] [phone] Pager [phone]

## 2021-10-06 DIAGNOSIS — Z961 Presence of intraocular lens: Secondary | ICD-10-CM | POA: Diagnosis not present

## 2021-10-06 DIAGNOSIS — H0288B Meibomian gland dysfunction left eye, upper and lower eyelids: Secondary | ICD-10-CM | POA: Diagnosis not present

## 2021-10-06 DIAGNOSIS — Z01419 Encounter for gynecological examination (general) (routine) without abnormal findings: Secondary | ICD-10-CM | POA: Diagnosis not present

## 2021-10-06 DIAGNOSIS — H0288A Meibomian gland dysfunction right eye, upper and lower eyelids: Secondary | ICD-10-CM | POA: Diagnosis not present

## 2021-10-06 DIAGNOSIS — H5213 Myopia, bilateral: Secondary | ICD-10-CM | POA: Diagnosis not present

## 2021-10-06 DIAGNOSIS — I10 Essential (primary) hypertension: Secondary | ICD-10-CM | POA: Diagnosis not present

## 2021-10-06 DIAGNOSIS — M810 Age-related osteoporosis without current pathological fracture: Secondary | ICD-10-CM | POA: Diagnosis not present

## 2021-10-06 DIAGNOSIS — H1045 Other chronic allergic conjunctivitis: Secondary | ICD-10-CM | POA: Diagnosis not present

## 2021-10-06 DIAGNOSIS — E7800 Pure hypercholesterolemia, unspecified: Secondary | ICD-10-CM | POA: Diagnosis not present

## 2021-11-09 DIAGNOSIS — M8589 Other specified disorders of bone density and structure, multiple sites: Secondary | ICD-10-CM | POA: Diagnosis not present

## 2021-11-09 DIAGNOSIS — Z1231 Encounter for screening mammogram for malignant neoplasm of breast: Secondary | ICD-10-CM | POA: Diagnosis not present

## 2021-12-28 DIAGNOSIS — Z85828 Personal history of other malignant neoplasm of skin: Secondary | ICD-10-CM | POA: Diagnosis not present

## 2021-12-28 DIAGNOSIS — L821 Other seborrheic keratosis: Secondary | ICD-10-CM | POA: Diagnosis not present

## 2021-12-28 DIAGNOSIS — D225 Melanocytic nevi of trunk: Secondary | ICD-10-CM | POA: Diagnosis not present

## 2021-12-28 DIAGNOSIS — L570 Actinic keratosis: Secondary | ICD-10-CM | POA: Diagnosis not present

## 2022-01-24 DIAGNOSIS — H0288B Meibomian gland dysfunction left eye, upper and lower eyelids: Secondary | ICD-10-CM | POA: Diagnosis not present

## 2022-01-24 DIAGNOSIS — H00025 Hordeolum internum left lower eyelid: Secondary | ICD-10-CM | POA: Diagnosis not present

## 2022-01-24 DIAGNOSIS — H0288A Meibomian gland dysfunction right eye, upper and lower eyelids: Secondary | ICD-10-CM | POA: Diagnosis not present

## 2022-02-17 DIAGNOSIS — M810 Age-related osteoporosis without current pathological fracture: Secondary | ICD-10-CM | POA: Diagnosis not present

## 2022-02-17 DIAGNOSIS — E7800 Pure hypercholesterolemia, unspecified: Secondary | ICD-10-CM | POA: Diagnosis not present

## 2022-02-17 DIAGNOSIS — I129 Hypertensive chronic kidney disease with stage 1 through stage 4 chronic kidney disease, or unspecified chronic kidney disease: Secondary | ICD-10-CM | POA: Diagnosis not present

## 2022-02-17 DIAGNOSIS — Z23 Encounter for immunization: Secondary | ICD-10-CM | POA: Diagnosis not present

## 2022-02-17 DIAGNOSIS — Z0000 Encounter for general adult medical examination without abnormal findings: Secondary | ICD-10-CM | POA: Diagnosis not present

## 2022-06-25 DIAGNOSIS — Z23 Encounter for immunization: Secondary | ICD-10-CM | POA: Diagnosis not present

## 2022-07-10 DIAGNOSIS — L821 Other seborrheic keratosis: Secondary | ICD-10-CM | POA: Diagnosis not present

## 2022-07-10 DIAGNOSIS — L570 Actinic keratosis: Secondary | ICD-10-CM | POA: Diagnosis not present

## 2022-07-10 DIAGNOSIS — D225 Melanocytic nevi of trunk: Secondary | ICD-10-CM | POA: Diagnosis not present

## 2022-07-10 DIAGNOSIS — Z85828 Personal history of other malignant neoplasm of skin: Secondary | ICD-10-CM | POA: Diagnosis not present

## 2022-08-15 DIAGNOSIS — Z713 Dietary counseling and surveillance: Secondary | ICD-10-CM | POA: Diagnosis not present

## 2022-10-16 DIAGNOSIS — Z01419 Encounter for gynecological examination (general) (routine) without abnormal findings: Secondary | ICD-10-CM | POA: Diagnosis not present

## 2022-10-17 DIAGNOSIS — H0288A Meibomian gland dysfunction right eye, upper and lower eyelids: Secondary | ICD-10-CM | POA: Diagnosis not present

## 2022-10-17 DIAGNOSIS — H0288B Meibomian gland dysfunction left eye, upper and lower eyelids: Secondary | ICD-10-CM | POA: Diagnosis not present

## 2022-10-17 DIAGNOSIS — Z961 Presence of intraocular lens: Secondary | ICD-10-CM | POA: Diagnosis not present

## 2022-10-24 ENCOUNTER — Ambulatory Visit (INDEPENDENT_AMBULATORY_CARE_PROVIDER_SITE_OTHER): Payer: BC Managed Care – PPO | Admitting: Orthopaedic Surgery

## 2022-10-24 ENCOUNTER — Other Ambulatory Visit: Payer: Self-pay

## 2022-10-24 ENCOUNTER — Encounter: Payer: Self-pay | Admitting: Orthopaedic Surgery

## 2022-10-24 VITALS — BP 169/104 | HR 60 | Ht 63.0 in | Wt 125.0 lb

## 2022-10-24 DIAGNOSIS — M25521 Pain in right elbow: Secondary | ICD-10-CM | POA: Diagnosis not present

## 2022-10-24 DIAGNOSIS — M25511 Pain in right shoulder: Secondary | ICD-10-CM

## 2022-10-24 DIAGNOSIS — M7711 Lateral epicondylitis, right elbow: Secondary | ICD-10-CM | POA: Diagnosis not present

## 2022-10-24 DIAGNOSIS — Z713 Dietary counseling and surveillance: Secondary | ICD-10-CM | POA: Diagnosis not present

## 2022-10-24 MED ORDER — METHYLPREDNISOLONE ACETATE 40 MG/ML IJ SUSP
40.0000 mg | INTRAMUSCULAR | Status: AC | PRN
  Administered 2022-10-24: 40 mg via INTRA_ARTICULAR

## 2022-10-24 MED ORDER — BUPIVACAINE HCL 0.5 % IJ SOLN
1.0000 mL | INTRAMUSCULAR | Status: AC | PRN
  Administered 2022-10-24: 1 mL via INTRA_ARTICULAR

## 2022-10-24 MED ORDER — LIDOCAINE HCL 1 % IJ SOLN
0.5000 mL | INTRAMUSCULAR | Status: AC | PRN
  Administered 2022-10-24: .5 mL

## 2022-10-24 NOTE — Progress Notes (Signed)
Office Visit Note   Patient: Shelly Tran           Date of Birth: 13-Oct-1960           MRN: [id number] Visit Date: 10/24/2022              Requested by: No referring provider defined for this encounter. PCP: Maurice Small, MD (Inactive)   Assessment & Plan: Visit Diagnoses:  1. Acute pain of right shoulder   2. Pain in right elbow     Plan: Patient with some recurrent tennis elbow injection performed we discussed modification avoid grabbing squeezing type activities.  She has her tennis elbow brace she could reapply it.  She will let us know if she has persistent symptoms.  Follow-Up Instructions: No follow-ups on file.   Orders:  Orders Placed This Encounter  Procedures   XR Shoulder Right   XR Elbow 2 Views Right   No orders of the defined types were placed in this encounter.     Procedures: Medium Joint Inj: R lateral epicondyle on 10/24/2022 8:31 PM Indications: pain Details: 22 G 1.5 in needle, anterolateral approach Medications: 1 mL bupivacaine 0.5 %; 40 mg methylPREDNISolone acetate 40 MG/ML; 0.5 mL lidocaine 1 % Outcome: tolerated well, no immediate complications Procedure, treatment alternatives, risks and benefits explained, specific risks discussed. Consent was given by the patient. Immediately prior to procedure a time out was called to verify the correct patient, procedure, equipment, support staff and site/side marked as required. Patient was prepped and draped in the usual sterile fashion.       Clinical Data: No additional findings.   Subjective: Chief Complaint  Patient presents with   Right Shoulder - Pain   Right Elbow - Pain    HPI 62 year old female with previous tennis elbow release years ago and also subacromial decompression right shoulder.  She likes to swim also rides a bike then she is may be gripping the handlebars more severely which may have caused her recurrent right elbow pain.  Radiates from the lateral epicondyle incision  down toward the wrist pain with gripping.  No neck pain no numbness or tingling she has used Advil intermittently.  Review of Systems patient has some elevated blood pressure 177/91 and 169/104.  She is on 2 blood pressure medicine she will talk with her PCP about this.  Additionally hyperlipidemia.   Objective: Vital Signs: BP (!) 169/104 Comment: right arm  Pulse 60   Ht 5\' 3"  (1.6 m)   Wt 125 lb (56.7 kg)   BMI 22.14 kg/m   Physical Exam Constitutional:      Appearance: She is well-developed.  HENT:     Head: Normocephalic.     Right Ear: External ear normal.     Left Ear: External ear normal. There is no impacted cerumen.  Eyes:     Pupils: Pupils are equal, round, and reactive to light.  Neck:     Thyroid: No thyromegaly.     Trachea: No tracheal deviation.  Cardiovascular:     Rate and Rhythm: Normal rate.  Pulmonary:     Effort: Pulmonary effort is normal.  Abdominal:     Palpations: Abdomen is soft.  Musculoskeletal:     Cervical back: No rigidity.  Skin:    General: Skin is warm and dry.  Neurological:     Mental Status: She is alert and oriented to person, place, and time.  Psychiatric:        Behavior: Behavior normal.  Ortho Exam tenderness lateral epicondyle right.  No tenderness to the left negative impingement.  Healed arthroscopic portals right shoulder.  No tenderness pronation supination radial head is normal.  Pain with resisted wrist extension.  Specialty Comments:  No specialty comments available.  Imaging: No results found.   PMFS History: Patient Active Problem List   Diagnosis Date Noted   Chest pain, unspecified 10/10/2012   Hypertension 10/09/2012   Hyperlipidemia 10/09/2012   Active smoker 10/09/2012   Past Medical History:  Diagnosis Date   High cholesterol    Hypertension     Family History  Problem Relation Age of Onset   High blood pressure Mother    Hyperlipidemia Mother    High blood pressure Father    Leukemia  Father     Past Surgical History:  Procedure Laterality Date   ABDOMINAL HYSTERECTOMY     ELBOW SURGERY     right   SHOULDER SURGERY     bilateral arthroscopic   Social History   Occupational History   Not on file  Tobacco Use   Smoking status: Former    Types: Cigarettes    Quit date: 04/2007    Years since quitting: 15.5   Smokeless tobacco: Never   Tobacco comments:    Not everyday   Vaping Use   Vaping Use: Never used  Substance and Sexual Activity   Alcohol use: No   Drug use: No   Sexual activity: Yes    Birth control/protection: None

## 2022-10-25 DIAGNOSIS — I129 Hypertensive chronic kidney disease with stage 1 through stage 4 chronic kidney disease, or unspecified chronic kidney disease: Secondary | ICD-10-CM | POA: Diagnosis not present

## 2022-11-15 DIAGNOSIS — Z1231 Encounter for screening mammogram for malignant neoplasm of breast: Secondary | ICD-10-CM | POA: Diagnosis not present

## 2022-11-22 DIAGNOSIS — I129 Hypertensive chronic kidney disease with stage 1 through stage 4 chronic kidney disease, or unspecified chronic kidney disease: Secondary | ICD-10-CM | POA: Diagnosis not present

## 2023-01-23 DIAGNOSIS — Z713 Dietary counseling and surveillance: Secondary | ICD-10-CM | POA: Diagnosis not present

## 2023-02-23 DIAGNOSIS — Z23 Encounter for immunization: Secondary | ICD-10-CM | POA: Diagnosis not present

## 2023-02-23 DIAGNOSIS — M810 Age-related osteoporosis without current pathological fracture: Secondary | ICD-10-CM | POA: Diagnosis not present

## 2023-02-23 DIAGNOSIS — I129 Hypertensive chronic kidney disease with stage 1 through stage 4 chronic kidney disease, or unspecified chronic kidney disease: Secondary | ICD-10-CM | POA: Diagnosis not present

## 2023-02-23 DIAGNOSIS — Z0000 Encounter for general adult medical examination without abnormal findings: Secondary | ICD-10-CM | POA: Diagnosis not present

## 2023-02-23 DIAGNOSIS — R809 Proteinuria, unspecified: Secondary | ICD-10-CM | POA: Diagnosis not present

## 2023-02-23 DIAGNOSIS — E7800 Pure hypercholesterolemia, unspecified: Secondary | ICD-10-CM | POA: Diagnosis not present

## 2023-04-10 DIAGNOSIS — L570 Actinic keratosis: Secondary | ICD-10-CM | POA: Diagnosis not present

## 2023-04-10 DIAGNOSIS — C44722 Squamous cell carcinoma of skin of right lower limb, including hip: Secondary | ICD-10-CM | POA: Diagnosis not present

## 2023-10-17 ENCOUNTER — Ambulatory Visit (INDEPENDENT_AMBULATORY_CARE_PROVIDER_SITE_OTHER): Admitting: Family

## 2023-10-17 ENCOUNTER — Other Ambulatory Visit (INDEPENDENT_AMBULATORY_CARE_PROVIDER_SITE_OTHER): Payer: Self-pay

## 2023-10-17 ENCOUNTER — Encounter: Payer: Self-pay | Admitting: Family

## 2023-10-17 DIAGNOSIS — S99921A Unspecified injury of right foot, initial encounter: Secondary | ICD-10-CM

## 2023-10-17 DIAGNOSIS — S92501A Displaced unspecified fracture of right lesser toe(s), initial encounter for closed fracture: Secondary | ICD-10-CM | POA: Diagnosis not present

## 2023-10-17 NOTE — Progress Notes (Signed)
 Office Visit Note   Patient: Shelly Tran           Date of Birth: [birth year]           MRN: [id number] Visit Date: 10/17/2023              Requested by: No referring provider defined for this encounter. PCP: Signa Dire, MD (Inactive)  Chief Complaint  Patient presents with   Right Foot - Pain      HPI: The patient is a 63 year old gentleman who is seen for concern of pain and swelling to the right foot second toe she noticed this as she was returning from a vacation about 2 months ago  This has gradually improved but continues to have swelling and aching  Describes as more of an annoyance than anything  Assessment & Plan: Visit Diagnoses: No diagnosis found.  Plan: Discussed stiff soled walking shoes.  Offered postop shoe.  Patient declined she will advance her activities as tolerated discussed typical course.  Follow-Up Instructions: No follow-ups on file.   Ortho Exam  Patient is alert, oriented, no adenopathy, well-dressed, normal affect, normal respiratory effort. On examination right foot the foot is plantigrade she has a palpable dorsalis pedis pulse  The second toe has mild edema mild erythema there is no sausage digit swelling.  No ecchymosis the nail plate is intact.    Imaging: No results found. No images are attached to the encounter.  Labs: Lab Results  Component Value Date   HGBA1C 5.4 10/10/2012     No results found for: ALBUMIN, PREALBUMIN, CBC  No results found for: MG No results found for: VD25OH  No results found for: PREALBUMIN    Latest Ref Rng & Units 10/09/2012    8:41 AM  CBC EXTENDED  WBC 4.0 - 10.5 K/uL 6.6   RBC 3.87 - 5.11 MIL/uL 4.71   Hemoglobin 12.0 - 15.0 g/dL 85.1   HCT 63.9 - 53.9 % 44.8   Platelets 150 - 400 K/uL 251   NEUT# 1.7 - 7.7 K/uL 3.2   Lymph# 0.7 - 4.0 K/uL 2.8      There is no height or weight on file to calculate BMI.  Orders:  No orders of the defined types were placed in this  encounter.  No orders of the defined types were placed in this encounter.    Procedures: No procedures performed  Clinical Data: No additional findings.  ROS:  All other systems negative, except as noted in the HPI. Review of Systems  Objective: Vital Signs: There were no vitals taken for this visit.  Specialty Comments:  No specialty comments available.  PMFS History: Patient Active Problem List   Diagnosis Date Noted   Right tennis elbow 10/24/2022   Chest pain, unspecified 10/10/2012   Hypertension 10/09/2012   Hyperlipidemia 10/09/2012   Current smoker 10/09/2012   Past Medical History:  Diagnosis Date   High cholesterol    Hypertension     Family History  Problem Relation Age of Onset   High blood pressure Mother    Hyperlipidemia Mother    High blood pressure Father    Leukemia Father     Past Surgical History:  Procedure Laterality Date   ABDOMINAL HYSTERECTOMY     ELBOW SURGERY     right   SHOULDER SURGERY     bilateral arthroscopic   Social History   Occupational History   Not on file  Tobacco Use   Smoking status: Former  Current packs/day: 0.00    Types: Cigarettes    Quit date: 04/2007    Years since quitting: 16.4   Smokeless tobacco: Never   Tobacco comments:    Not everyday   Vaping Use   Vaping status: Never Used  Substance and Sexual Activity   Alcohol use: No   Drug use: No   Sexual activity: Yes    Birth control/protection: None

## 2024-02-25 ENCOUNTER — Encounter: Payer: Self-pay | Admitting: Radiology

## 2024-04-23 ENCOUNTER — Ambulatory Visit: Admitting: Family

## 2024-04-23 DIAGNOSIS — M7741 Metatarsalgia, right foot: Secondary | ICD-10-CM

## 2024-04-25 NOTE — Progress Notes (Signed)
 "  Office Visit Note   Patient: Shelly Tran           Date of Birth: 02-01-1961           MRN: [id number] Visit Date: 04/23/2024              Requested by: No referring provider defined for this encounter. PCP: Signa Dire, MD (Inactive)  Chief Complaint  Patient presents with   Right Foot - Pain      HPI: The patient is a 64 year old woman who presents today for ongoing pain associated with her second toe pain with weightbearing only this continues to bother her beneath the ball of her foot, second metatarsal head.  She has no pain at rest only with activity she has been using stiff soled walking shoes and has had reduction in her pain with supportive shoe wear  Assessment & Plan: Visit Diagnoses: No diagnosis found.  Plan: Discussed elongated second metatarsal today recommended heel cord stretching supportive shoe wear offered a cookie for her shoe wear to support the arch.  She voiced understanding will work on these things with conservative measures she will follow-up in 4 weeks if she fails to improve  Follow-Up Instructions: No follow-ups on file.   Ortho Exam  Patient is alert, oriented, no adenopathy, well-dressed, normal affect, normal respiratory effort. On examination of the right foot the foot is plantigrade there is no edema no erythema no ecchymosis she is tender to palpation beneath the second metatarsal head there is heel cord tightness with dorsiflexion to neutral    Imaging: No results found. No images are attached to the encounter.  Labs: Lab Results  Component Value Date   HGBA1C 5.4 10/10/2012     No results found for: ALBUMIN, PREALBUMIN, CBC  No results found for: MG No results found for: VD25OH  No results found for: PREALBUMIN    Latest Ref Rng & Units 10/09/2012    8:41 AM  CBC EXTENDED  WBC 4.0 - 10.5 K/uL 6.6   RBC 3.87 - 5.11 MIL/uL 4.71   Hemoglobin 12.0 - 15.0 g/dL 85.1   HCT 63.9 - 53.9 % 44.8   Platelets 150 -  400 K/uL 251   NEUT# 1.7 - 7.7 K/uL 3.2   Lymph# 0.7 - 4.0 K/uL 2.8      There is no height or weight on file to calculate BMI.  Orders:  No orders of the defined types were placed in this encounter.  No orders of the defined types were placed in this encounter.    Procedures: No procedures performed  Clinical Data: No additional findings.  ROS:  All other systems negative, except as noted in the HPI. Review of Systems  Objective: Vital Signs: There were no vitals taken for this visit.  Specialty Comments:  No specialty comments available.  PMFS History: Patient Active Problem List   Diagnosis Date Noted   Right tennis elbow 10/24/2022   Chest pain, unspecified 10/10/2012   Hypertension 10/09/2012   Hyperlipidemia 10/09/2012   Current smoker 10/09/2012   Past Medical History:  Diagnosis Date   High cholesterol    Hypertension     Family History  Problem Relation Age of Onset   High blood pressure Mother    Hyperlipidemia Mother    High blood pressure Father    Leukemia Father     Past Surgical History:  Procedure Laterality Date   ABDOMINAL HYSTERECTOMY     ELBOW SURGERY     right  SHOULDER SURGERY     bilateral arthroscopic   Social History   Occupational History   Not on file  Tobacco Use   Smoking status: Former    Current packs/day: 0.00    Types: Cigarettes    Quit date: 04/2007    Years since quitting: 17.0   Smokeless tobacco: Never   Tobacco comments:    Not everyday   Vaping Use   Vaping status: Never Used  Substance and Sexual Activity   Alcohol use: No   Drug use: No   Sexual activity: Yes    Birth control/protection: None       "

## 2024-05-02 ENCOUNTER — Encounter: Payer: Self-pay | Admitting: Family
